# Patient Record
Sex: Female | Born: 2000
Health system: Southern US, Community
[De-identification: ages and names within clinical notes are randomized; demographics above are authoritative.]

## PROBLEM LIST (undated history)

## (undated) DIAGNOSIS — J020 Streptococcal pharyngitis: Secondary | ICD-10-CM

## (undated) DIAGNOSIS — G43909 Migraine, unspecified, not intractable, without status migrainosus: Secondary | ICD-10-CM

## (undated) DIAGNOSIS — F909 Attention-deficit hyperactivity disorder, unspecified type: Secondary | ICD-10-CM

## (undated) HISTORY — DX: Attention-deficit hyperactivity disorder, unspecified type: F90.9

## (undated) HISTORY — DX: Streptococcal pharyngitis: J02.0

---
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
## 2020-02-05 ENCOUNTER — Other Ambulatory Visit: Payer: Self-pay | Admitting: Family Medicine

## 2020-02-06 ENCOUNTER — Other Ambulatory Visit (HOSPITAL_BASED_OUTPATIENT_CLINIC_OR_DEPARTMENT_OTHER): Payer: Self-pay | Admitting: Family Medicine

## 2020-02-06 MED FILL — SERTRALINE HCL 100 MG TABS: 100 | 90 days supply | Qty: 90 | Fill #0

## 2020-02-06 MED FILL — SERTRALINE HCL 25 MG TABLET: 25 | 30 days supply | Qty: 30 | Fill #0

## 2020-02-12 ENCOUNTER — Telehealth (INDEPENDENT_AMBULATORY_CARE_PROVIDER_SITE_OTHER): Payer: 59 | Admitting: Psychiatry

## 2020-02-12 ENCOUNTER — Encounter (HOSPITAL_COMMUNITY): Payer: Self-pay | Admitting: Psychiatry

## 2020-02-12 ENCOUNTER — Other Ambulatory Visit: Payer: Self-pay

## 2020-02-12 VITALS — Wt 109.0 lb

## 2020-02-12 DIAGNOSIS — F411 Generalized anxiety disorder: Secondary | ICD-10-CM

## 2020-02-12 DIAGNOSIS — F900 Attention-deficit hyperactivity disorder, predominantly inattentive type: Secondary | ICD-10-CM

## 2020-02-12 NOTE — Progress Notes (Signed)
Virtual Visit via Video Note  I connected with Brenda Mendoza on 02/12/20 at 11:00 AM EST by a video enabled telemedicine application and verified that I am speaking with the correct person using two identifiers.  Location: Patient: Home Provider: Home office   I discussed the limitations of evaluation and management by telemedicine and the availability of in person appointments. The patient expressed understanding and agreed to proceed.  History of Present Illness: Brenda Mendoza is a 19 year old Caucasian female who was seen first time 3 months ago.  She was referred from her PCP Dr. Linford Arnold for the management of ADD and anxiety symptoms.  We have recommended to try low-dose Vyvanse as patient is already taking the Zoloft and the dose was increased by the PCP recently.  Patient never started the Vyvanse because she was concerned and anxious to start a new medication.  She was not sure how it will react to her body but also apologized not calling us about it.  She told month ago broke up with her boyfriend because she believes they do not get along.  Relationship was almost 19 years old.  She feel worse than her anxiety went up.  Her PCP added another 25 mg of Zoloft and now she is taking Zoloft 125 mg daily.  She feels her anxiety is somewhat better but she continues to struggle with attention, focus, multitasking.  She has to write down the list of tasks to complete her work.  She admitted easily forgetful and having memory issues and get distracted.  She like to try the medicine.  She struggle with staying in sleep and she had lost few pounds since the last visit.  She had a good support from her roommate and her sister.  She is also thinking to go back to school to start cosmetology which she wanted to do.  She denies drinking or using any illegal substances.  She denies any mania, psychosis, hallucination or any suicidal thoughts.  She is working in [employer] and she was promoted few months ago.  Her  parents are divorced.  She has an older sister.  Past Psychiatric History: H/O anxiety since school age.  Saw psychiatrist Dr. Milana Kidney for few months.  Diagnosed with ADD after psychological testing at [facility] by Dustin Folks and given medicine but do not recall.  Had tried Effexor but caused withdrawal symptoms, Wellbutrin and Viibryd did not work.  No history of suicidal attempt, inpatient, mania, psychosis, PTSD or OCD symptoms.  Psychiatric Specialty Exam: Physical Exam  Review of Systems  Weight 109 lb (49.4 kg).There is no height or weight on file to calculate BMI.  General Appearance: Casual and hair dyed.  Eye Contact:  Good  Speech:  Normal Rate  Volume:  Decreased  Mood:  Anxious  Affect:  Congruent  Thought Process:  Goal Directed  Orientation:  Full (Time, Place, and Person)  Thought Content:  Rumination  Suicidal Thoughts:  No  Homicidal Thoughts:  No  Memory:  Immediate;   Good Recent;   Good Remote;   Fair  Judgement:  Fair  Insight:  Shallow  Psychomotor Activity:  Increased  Concentration:  Concentration: Fair and Attention Span: Fair  Recall:  Good  Fund of Knowledge:  Good  Language:  Good  Akathisia:  No  Handed:  Right  AIMS (if indicated):     Assets:  Communication Skills Desire for Improvement Housing Resilience Social Support Talents/Skills Transportation  ADL's:  Intact  Cognition:  WNL  Sleep:   fair      Assessment and Plan: Generalized anxiety disorder.  ADD, inattentive type.  Reassurance given about ADD medication.  Explained that in the beginning any psychotropic medication can cause side effects but eventually it go away.  I discussed most of the ADD medicine can cause worsening of anxiety but she is already taking hydroxyzine and Zoloft 125 mg to help with anxiety.  However if she feels her anxiety is getting worse then she can call us immediately.  Patient promised to stop the medication and I recommend to try on the days  when she does not go to work to see how she get react to the medicine.  Recommended to call us back if she has any question.  We will follow-up in 3 weeks.  No new medicine given since patient has not started and she has the prescription.  Her Zoloft and hydroxyzine is given by her PCP.  She is also taking Topamax for her migraines.  Follow Up Instructions:    I discussed the assessment and treatment plan with the patient. The patient was provided an opportunity to ask questions and all were answered. The patient agreed with the plan and demonstrated an understanding of the instructions.   The patient was advised to call back or seek an in-person evaluation if the symptoms worsen or if the condition fails to improve as anticipated.  I provided 15 minutes of non-face-to-face time during this encounter.   Cleotis Nipper, MD

## 2020-03-06 ENCOUNTER — Encounter: Payer: Self-pay | Admitting: Family Medicine

## 2020-03-06 ENCOUNTER — Ambulatory Visit (INDEPENDENT_AMBULATORY_CARE_PROVIDER_SITE_OTHER): Payer: 59 | Admitting: Family Medicine

## 2020-03-06 ENCOUNTER — Telehealth (HOSPITAL_COMMUNITY): Payer: 59 | Admitting: Psychiatry

## 2020-03-06 ENCOUNTER — Other Ambulatory Visit (HOSPITAL_BASED_OUTPATIENT_CLINIC_OR_DEPARTMENT_OTHER): Payer: Self-pay | Admitting: Family Medicine

## 2020-03-06 VITALS — BP 102/57 | HR 64 | Ht 61.0 in | Wt 112.0 lb

## 2020-03-06 DIAGNOSIS — R6889 Other general symptoms and signs: Secondary | ICD-10-CM | POA: Diagnosis not present

## 2020-03-06 DIAGNOSIS — Z113 Encounter for screening for infections with a predominantly sexual mode of transmission: Secondary | ICD-10-CM

## 2020-03-06 DIAGNOSIS — F418 Other specified anxiety disorders: Secondary | ICD-10-CM | POA: Diagnosis not present

## 2020-03-06 DIAGNOSIS — G43109 Migraine with aura, not intractable, without status migrainosus: Secondary | ICD-10-CM | POA: Diagnosis not present

## 2020-03-06 DIAGNOSIS — N898 Other specified noninflammatory disorders of vagina: Secondary | ICD-10-CM | POA: Diagnosis not present

## 2020-03-06 MED ORDER — SERTRALINE HCL 25 MG PO TABS: 25.0000 mg | ORAL_TABLET | Freq: Every day | ORAL | 0 refills | Status: DC

## 2020-03-06 MED FILL — SERTRALINE HCL 25 MG TABLET: 25 | 30 days supply | Qty: 30 | Fill #0

## 2020-03-06 NOTE — Assessment & Plan Note (Addendum)
Well on current regimen of Topamax and Maxalt.  Follow-up in 6 months.  Okay to return sooner if having more frequent headaches.  If doing well at that point we might even consider tapering the Topamax.

## 2020-03-06 NOTE — Progress Notes (Signed)
Established Patient Office Visit  Subjective:  Patient ID: Brenda Mendoza, female    DOB: [birth year]  Age: 19 y.o. MRN: [id number]  CC:  Chief Complaint  Patient presents with  . Migraine    HPI [name]     Here today to follow-up for migraine headaches-overall she is actually doing well she still on a very low-dose of Topamax but feels like it is been fairly effective she is only had a few migraine since I last saw her 3 months ago she feels like the Maxalt works about 75% of the time not 100%.  But has been happy with her regimen thus far.  Is also been working with her psychiatrist and therapist and had to reschedule her appointment that was today evidently it was double booked.  She has been taking extra 25 mg of Zoloft and feels like that is been helpful but she also thinks now that its been a couple months since her break-up that that has been helpful as well with just time passing and really cannot coming to grips with what is going on.  He says she never actually tried the Vyvanse to help with her memory she was really struggling with her memory and focus.  She still has the prescription and says she might try it but she is not sure.  She also reports that she has been having a yellow-colored discharge for quite some time.  She just wanted to make sure there was normal.  She has not had any STD testing done since her break-up with her long-term boyfriend.  He also reports that she feels cold all the time.  She is asymptomatic.  No irritation, itching, rash, vaginal odor.  Past Medical History:  Diagnosis Date  . ADHD (attention deficit hyperactivity disorder)   . Migraine   . Strep throat    multiple times    Past Surgical History:  Procedure Laterality Date  . TONSILLECTOMY AND ADENOIDECTOMY  2009  . TYMPANOSTOMY TUBE PLACEMENT  2004    Family History  Problem Relation Age of Onset  . Depression Mother   . Migraines Mother   . ADD / ADHD Father   .  Pancreatic cancer Maternal Grandmother 74  . Lung cancer Maternal Grandfather 48  . Heart disease Other   . Depression Sister     Social History   Socioeconomic History  . Marital status: Single    Spouse name: Not on file  . Number of children: Not on file  . Years of education: Not on file  . Highest education level: Not on file  Occupational History  . Occupation: Consulting civil engineer  Tobacco Use  . Smoking status: Passive Smoke Exposure - Never Smoker  . Smokeless tobacco: Never Used  . Tobacco comment: Outside smoking by mom  Vaping Use  . Vaping Use: Never used  Substance and Sexual Activity  . Alcohol use: No    Alcohol/week: 0.0 standard drinks  . Drug use: No  . Sexual activity: Not on file  Other Topics Concern  . Not on file  Social History Narrative   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 She likes to horseback ride. Otherwise no regular exercise.   Social Determinants of Health   Financial Resource Strain:   . Difficulty of Paying Living Expenses: Not on file  Food Insecurity:   . Worried About Programme researcher, broadcasting/film/video in the Last Year: Not on file  . Ran Out of Food in the  Last Year: Not on file  Transportation Needs:   . Lack of Transportation (Medical): Not on file  . Lack of Transportation (Non-Medical): Not on file  Physical Activity:   . Days of Exercise per Week: Not on file  . Minutes of Exercise per Session: Not on file  Stress:   . Feeling of Stress : Not on file  Social Connections:   . Frequency of Communication with Friends and Family: Not on file  . Frequency of Social Gatherings with Friends and Family: Not on file  . Attends Religious Services: Not on file  . Active Member of Clubs or Organizations: Not on file  . Attends Banker Meetings: Not on file  . Marital Status: Not on file  Intimate Partner Violence:   . Fear of Current or Ex-Partner: Not on file  . Emotionally Abused: Not on file  . Physically Abused: Not on file  .  Sexually Abused: Not on file    Outpatient Medications Prior to Visit  Medication Sig Dispense Refill  . drospirenone-ethinyl estradiol (YASMIN) 3-0.03 MG tablet Take 1 tablet by mouth daily. 84 tablet 4  . hydrOXYzine (ATARAX/VISTARIL) 25 MG tablet TAKE 1 - 2 TABLETS BY MOUTH THREE TIMES DAILY AS NEEDED FOR ANXIETY 30 tablet 0  . lisdexamfetamine (VYVANSE) 20 MG capsule Take 1 capsule (20 mg total) by mouth daily. 30 capsule 0  . rizatriptan (MAXALT-MLT) 5 MG disintegrating tablet Take 1 tablet (5 mg total) by mouth as needed for migraine. May repeat in 2 hours if needed. This is 90 day supply 30 tablet 2  . sertraline (ZOLOFT) 100 MG tablet TAKE 1 TABLET (100 MG TOTAL) BY MOUTH DAILY. *THIS TAKES PLACE OF SERTRALINE 50MG  2 TABS DAILY* 90 tablet 0  . topiramate (TOPAMAX) 25 MG tablet Take 1 tablet (25 mg total) by mouth daily. 90 tablet 1  . sertraline (ZOLOFT) 25 MG tablet TAKE 1 TABLET (25 MG TOTAL) BY MOUTH DAILY. TAKE AT THE SAME TIME AS HER 100 MG DOSE. 30 tablet 0   No facility-administered medications prior to visit.    Allergies  Allergen Reactions  . Intuniv [Guanfacine] Other (See Comments)    Excess sedation, Daytrana patch.   . Prochlorperazine Rash    ROS Review of Systems    Objective:    Physical Exam Vitals reviewed.  Constitutional:      Appearance: She is well-developed.  HENT:     Head: Normocephalic and atraumatic.  Eyes:     Conjunctiva/sclera: Conjunctivae normal.  Cardiovascular:     Rate and Rhythm: Normal rate.  Pulmonary:     Effort: Pulmonary effort is normal.  Skin:    General: Skin is dry.     Coloration: Skin is not pale.  Neurological:     Mental Status: She is alert and oriented to person, place, and time.  Psychiatric:        Behavior: Behavior normal.     BP (!) 102/57   Pulse 64   Ht 5\' 1"  (1.549 m)   Wt 112 lb (50.8 kg)   LMP 03/06/2020 (Exact Date)   SpO2 100%   BMI 21.16 kg/m  Wt Readings from Last 3 Encounters:   03/06/20 112 lb (50.8 kg) (20 %, Z= -0.85)*  12/06/19 114 lb (51.7 kg) (24 %, Z= -0.69)*  10/11/19 111 lb (50.3 kg) (19 %, Z= -0.87)*   * Growth percentiles are based on CDC (Girls, 2-20 Years) data.     Health Maintenance Due  Topic Date Due  . HIV Screening  Never done    There are no preventive care reminders to display for this patient.  Lab Results  Component Value Date   TSH 2.19 06/22/2016   Lab Results  Component Value Date   WBC 7.1 10/11/2019   HGB 13.5 10/11/2019   HCT 40.8 10/11/2019   MCV 85.7 10/11/2019   PLT 373 10/11/2019   Lab Results  Component Value Date   NA 139 10/11/2019   K 4.7 10/11/2019   CO2 28 10/11/2019   GLUCOSE 81 10/11/2019   BUN 13 10/11/2019   CREATININE 0.68 10/11/2019   BILITOT 0.5 10/11/2019   ALKPHOS 81 06/26/2015   AST 16 10/11/2019   ALT 12 10/11/2019   PROT 6.5 10/11/2019   ALBUMIN 4.0 06/26/2015   CALCIUM 9.5 10/11/2019   Lab Results  Component Value Date   CHOL 268 (H) 10/11/2019   Lab Results  Component Value Date   HDL 68 10/11/2019   Lab Results  Component Value Date   LDLCALC 168 (H) 10/11/2019   Lab Results  Component Value Date   TRIG 170 (H) 10/11/2019   Lab Results  Component Value Date   CHOLHDL 3.9 10/11/2019   Lab Results  Component Value Date   HGBA1C n 01/08/2015      Assessment & Plan:   Problem List Items Addressed This Visit      Cardiovascular and Mediastinum   Migraine with aura and without status migrainosus, not intractable - Primary (Chronic)    Well on current regimen of Topamax and Maxalt.  Follow-up in 6 months.  Okay to return sooner if having more frequent headaches.  If doing well at that point we might even consider tapering the Topamax.      Relevant Medications   sertraline (ZOLOFT) 25 MG tablet     Other   Depression with anxiety    She has continue the extra 25 mg of sertraline on top of her 100 mg and feels like it is been helpful she would like to continue  it for a little bit longer.  Did encourage her to let her psychiatrist know that she is taking it when they follow-up later this month.      Relevant Medications   sertraline (ZOLOFT) 25 MG tablet    Other Visit Diagnoses    Vaginal discharge       Relevant Orders   WET PREP FOR TRICH, YEAST, CLUE   RPR   C. trachomatis/N. gonorrhoeae RNA   Cold sensitivity       Relevant Orders   TSH + free T4   Fe+TIBC+Fer   Screen for STD (sexually transmitted disease)       Relevant Orders   WET PREP FOR TRICH, YEAST, CLUE   HIV Antibody (routine testing w rflx)   RPR   C. trachomatis/N. gonorrhoeae RNA      Cold sensitivity-we will check thyroid and iron levels.  Recommend screening for STDs since break-up of her long-term boyfriend.  Explained that yellow vaginal discharge can be physiologic and is not necessarily abnormal especially without symptoms.  We will also check a wet prep and for GC and chlamydia.  Meds ordered this encounter  Medications  . sertraline (ZOLOFT) 25 MG tablet    Sig: Take 1 tablet (25 mg total) by mouth daily. Take at the same time as her 100 mg dose.    Dispense:  30 tablet    Refill:  0  Follow-up: Return in about 6 months (around 09/04/2020) for Migraines .    Nani Gasseratherine Clerence Gubser, MD

## 2020-03-06 NOTE — Assessment & Plan Note (Signed)
She has continue the extra 25 mg of sertraline on top of her 100 mg and feels like it is been helpful she would like to continue it for a little bit longer.  Did encourage her to let her psychiatrist know that she is taking it when they follow-up later this month.

## 2020-03-13 ENCOUNTER — Other Ambulatory Visit: Payer: Self-pay

## 2020-03-13 ENCOUNTER — Telehealth (INDEPENDENT_AMBULATORY_CARE_PROVIDER_SITE_OTHER): Payer: 59 | Admitting: Psychiatry

## 2020-03-13 ENCOUNTER — Encounter (HOSPITAL_COMMUNITY): Payer: Self-pay | Admitting: Psychiatry

## 2020-03-13 VITALS — Wt 112.0 lb

## 2020-03-13 DIAGNOSIS — F418 Other specified anxiety disorders: Secondary | ICD-10-CM

## 2020-03-13 DIAGNOSIS — F900 Attention-deficit hyperactivity disorder, predominantly inattentive type: Secondary | ICD-10-CM | POA: Diagnosis not present

## 2020-03-13 NOTE — Progress Notes (Signed)
Virtual Visit via Telephone Note  I connected with Brenda Mendoza on 03/13/20 at  1:20 PM EST by telephone and verified that I am speaking with the correct person using two identifiers.  Location: Patient: home Provider: home office   I discussed the limitations, risks, security and privacy concerns of performing an evaluation and management service by telephone and the availability of in person appointments. I also discussed with the patient that there may be a patient responsible charge related to this service. The patient expressed understanding and agreed to proceed.   History of Present Illness: Patient is evaluated by phone session.  She is on the phone by herself.  We started her on low-dose Vyvanse but she took it for only few days because she noticed it did not help her focus and may actually felt her focus is worse.  But she is not sure because recently she received booster dose of maternal vaccine and has been feeling sick.  She is tired, sleeping too much.  She needed more time to try the Vyvanse.  Overall she feels her depression is okay.  She is getting Zoloft 125 mg prescribed by her PCP.  She denies any crying spells or any feeling of hopelessness.  She admitted continued struggle with multitasking, attention, concentration.  She has to write things to remind herself.  Since that broke up with the boyfriend she has not started any new relationship.  She has a plan to celebrate Christmas with the family.  Her appetite is okay.  She admitted feeling tired but no change in her appetite.  She denies any mania, psychosis, hallucination.  She continues to work some peak full-time and some week part-time at [employer].  Her job is okay other than she tends to forget sometimes.   Psychiatric Specialty Exam: Physical Exam  Review of Systems  Weight 112 lb (50.8 kg), last menstrual period 03/06/2020.There is no height or weight on file to calculate BMI.  General Appearance: NA  Eye Contact:   NA  Speech:  Slow  Volume:  Normal  Mood:  tired  Affect:  NA  Thought Process:  Goal Directed  Orientation:  Full (Time, Place, and Person)  Thought Content:  WDL  Suicidal Thoughts:  No  Homicidal Thoughts:  No  Memory:  Immediate;   ok Recent;   ok Remote;   ok  Judgement:  Intact  Insight:  Present  Psychomotor Activity:  NA  Concentration:  Concentration: Fair and Attention Span: Fair  Recall:  Fair  Fund of Knowledge:  Good  Language:  Good  Akathisia:  No  Handed:  Right  AIMS (if indicated):     Assets:  Communication Skills Desire for Improvement Housing Resilience Social Support Talents/Skills Transportation  ADL's:  Intact  Cognition:  WNL  Sleep:   too much      Assessment and Plan: ADD, inattentive type.  Anxiety.  Patient anxiety and depression is well controlled by Zoloft 125 mg daily.  At this time she is not taking Vyvanse 20 mg but like to give more time to see if that helps her.  I discussed the other option is to try Strattera since it does work differently to help the focus.  Patient promised that she will call us but like to give more time.  Vyvanse.  Discussed medication side effects and benefits.  Encouraged to call us back if she is any question, concern or if she feels worsening of the symptom.  She has pills  remaining for Vyvanse and if she feels it is working then she will call us for new refill.  She is taking Topamax and hydroxyzine.  Recommended follow-up in 2 months.  Follow Up Instructions:    I discussed the assessment and treatment plan with the patient. The patient was provided an opportunity to ask questions and all were answered. The patient agreed with the plan and demonstrated an understanding of the instructions.   The patient was advised to call back or seek an in-person evaluation if the symptoms worsen or if the condition fails to improve as anticipated.  I provided 17 minutes of non-face-to-face time during this  encounter.   Cleotis Nipper, MD

## 2020-03-14 ENCOUNTER — Ambulatory Visit (INDEPENDENT_AMBULATORY_CARE_PROVIDER_SITE_OTHER): Payer: 59 | Admitting: Physician Assistant

## 2020-03-14 ENCOUNTER — Encounter: Payer: Self-pay | Admitting: Physician Assistant

## 2020-03-14 ENCOUNTER — Other Ambulatory Visit (HOSPITAL_BASED_OUTPATIENT_CLINIC_OR_DEPARTMENT_OTHER): Payer: Self-pay | Admitting: Physician Assistant

## 2020-03-14 ENCOUNTER — Other Ambulatory Visit: Payer: Self-pay

## 2020-03-14 VITALS — BP 114/73 | HR 78 | Ht 61.0 in | Wt 114.0 lb

## 2020-03-14 DIAGNOSIS — K2900 Acute gastritis without bleeding: Secondary | ICD-10-CM | POA: Diagnosis not present

## 2020-03-14 MED ORDER — LIDOCAINE VISCOUS HCL 2 % MT SOLN
15.0000 mL | Freq: Once | OROMUCOSAL | Status: AC
  Administered 2020-03-14: 15 mL via OROMUCOSAL

## 2020-03-14 MED ORDER — HYOSCYAMINE SULFATE 0.125 MG PO TBDP
0.1250 mg | ORAL_TABLET | Freq: Once | ORAL | Status: AC
  Administered 2020-03-14: 0.125 mg via SUBLINGUAL

## 2020-03-14 MED ORDER — ALUM & MAG HYDROXIDE-SIMETH 200-200-20 MG/5ML PO SUSP
30.0000 mL | Freq: Once | ORAL | Status: AC
Start: 2020-03-14 — End: 2020-03-14
  Administered 2020-03-14: 30 mL via ORAL

## 2020-03-14 MED ORDER — OMEPRAZOLE 20 MG PO CPDR: 20.0000 mg | DELAYED_RELEASE_CAPSULE | Freq: Every day | ORAL | 1 refills | Status: DC

## 2020-03-14 MED FILL — OMEPRAZOLE 20 MG CAP: 20 | 30 days supply | Qty: 30 | Fill #0

## 2020-03-14 MED FILL — DROSPIRENONE-EE 3-0.03 MG T: 3-0.03 | 84 days supply | Qty: 84 | Fill #2

## 2020-03-14 NOTE — Progress Notes (Addendum)
Acute Office Visit  Subjective:    Patient ID: Brenda Mendoza, female    DOB: June 19, 2000, 19 y.o.   MRN: [id number]  Chief Complaint  Patient presents with  . Abdominal Pain    HPI Patient is in today for epigastric abdominal pain x two days. She had her COVID booster shot on Tuesday and took 2 aleeve and alkaseltzer cold on Wednesday morning and then pain started Wednesday night. Pain comes and goes and does wake her from sleep during the night. Pain is the same during the day and night. Pain feels like stabbing.  Patient reports a similar episode of pain within the last year after taking Excedrin.  Patient has been eating. Grilled cheese didn't make the pain worse but she tried to eat tomato soup and that did exacerbate pain. Patient reports drinking propel.   She denies any melena or hematochezia. No vomiting.   Patient reports occasional alcohol consumption but not within the last week. Patient denies vaping/smoking/drug use.  Past Medical History:  Diagnosis Date  . ADHD (attention deficit hyperactivity disorder)   . Migraine   . Strep throat    multiple times    Past Surgical History:  Procedure Laterality Date  . TONSILLECTOMY AND ADENOIDECTOMY  2009  . TYMPANOSTOMY TUBE PLACEMENT  2004    Family History  Problem Relation Age of Onset  . Depression Mother   . Migraines Mother   . ADD / ADHD Father   . Pancreatic cancer Maternal Grandmother 52  . Lung cancer Maternal Grandfather 48  . Heart disease Other   . Depression Sister     Social History   Socioeconomic History  . Marital status: Single    Spouse name: Not on file  . Number of children: Not on file  . Years of education: Not on file  . Highest education level: Not on file  Occupational History  . Occupation: Consulting civil engineer  Tobacco Use  . Smoking status: Passive Smoke Exposure - Never Smoker  . Smokeless tobacco: Never Used  . Tobacco comment: Outside smoking by mom  Vaping Use  . Vaping Use:  Never used  Substance and Sexual Activity  . Alcohol use: No    Alcohol/week: 0.0 standard drinks  . Drug use: No  . Sexual activity: Not on file  Other Topics Concern  . Not on file  Social History Narrative   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 She likes to horseback ride. Otherwise no regular exercise.   Social Determinants of Health   Financial Resource Strain: Not on file  Food Insecurity: Not on file  Transportation Needs: Not on file  Physical Activity: Not on file  Stress: Not on file  Social Connections: Not on file  Intimate Partner Violence: Not on file    Outpatient Medications Prior to Visit  Medication Sig Dispense Refill  . drospirenone-ethinyl estradiol (YASMIN) 3-0.03 MG tablet Take 1 tablet by mouth daily. 84 tablet 4  . hydrOXYzine (ATARAX/VISTARIL) 25 MG tablet TAKE 1 - 2 TABLETS BY MOUTH THREE TIMES DAILY AS NEEDED FOR ANXIETY 30 tablet 0  . lisdexamfetamine (VYVANSE) 20 MG capsule Take 1 capsule (20 mg total) by mouth daily. 30 capsule 0  . rizatriptan (MAXALT-MLT) 5 MG disintegrating tablet Take 1 tablet (5 mg total) by mouth as needed for migraine. May repeat in 2 hours if needed. This is 90 day supply 30 tablet 2  . sertraline (ZOLOFT) 100 MG tablet TAKE 1 TABLET (100 MG TOTAL) BY MOUTH  DAILY. *THIS TAKES PLACE OF SERTRALINE 50MG  2 TABS DAILY* 90 tablet 0  . sertraline (ZOLOFT) 25 MG tablet Take 1 tablet (25 mg total) by mouth daily. Take at the same time as her 100 mg dose. 30 tablet 0  . topiramate (TOPAMAX) 25 MG tablet Take 1 tablet (25 mg total) by mouth daily. 90 tablet 1   No facility-administered medications prior to visit.    Allergies  Allergen Reactions  . Intuniv [Guanfacine] Other (See Comments)    Excess sedation, Daytrana patch.   . Prochlorperazine Rash    Review of Systems  Gastrointestinal: Positive for nausea. Negative for constipation, diarrhea and vomiting.       Objective:    Physical Exam Vitals reviewed.   Constitutional:      Appearance: She is well-developed.  HENT:     Head: Normocephalic.  Abdominal:     General: Bowel sounds are normal. There is no distension.     Palpations: Abdomen is soft.     Tenderness: There is abdominal tenderness in the right upper quadrant and epigastric area. There is no right CVA tenderness, left CVA tenderness, guarding or rebound. Negative signs include McBurney's sign.  Neurological:     General: No focal deficit present.     Mental Status: She is alert.  Psychiatric:        Mood and Affect: Mood normal.     BP 114/73   Pulse 78   Ht 5\' 1"  (1.549 m)   Wt 114 lb (51.7 kg)   LMP 03/06/2020 (Exact Date)   SpO2 100%   BMI 21.54 kg/m         Assessment & Plan:   Acute Gastritis - Abdominal pain exacerbated by NSAID use and acidic foods (tomatos)  - GI cocktail given in office today - Start PPI for one month  - f/u in one month with PCP or sooner if needed  Ddx - Unlikely to be acute cholecystitis as pain is not worsened by food and patient does not have risk factors - Unlikely to be pancreatitis due to lack of risk factors but plan to check lipase to r/o  Suspicious of underlying acid reflux given acute gastritis episode after one dose of NSAIDs. Handout provided and education given on avoiding acidic foods, alcohol, NSAIDs. She may need to continue on omeprazole daily. Please discuss ongoing symptoms with PCP.   Meds ordered this encounter  Medications  . lidocaine (XYLOCAINE) 2 % viscous mouth solution 15 mL  . hyoscyamine (ANASPAZ) disintergrating tablet 0.125 mg  . alum & mag hydroxide-simeth (MAALOX/MYLANTA) 200-200-20 MG/5ML suspension 30 mL     . PA-C, have reviewed and agree with the above documentation in it's entirety.   Spent 30 minutes with patient discussing potential causes and current treatment plan including medications and side effects.

## 2020-03-14 NOTE — Patient Instructions (Signed)
Gastritis, Adult  Gastritis is swelling (inflammation) of the stomach. Gastritis can develop quickly (acute). It can also develop slowly over time (chronic). It is important to get help for this condition. If you do not get help, your stomach can bleed, and you can get sores (ulcers) in your stomach. What are the causes? This condition may be caused by:  Germs that get to your stomach.  Drinking too much alcohol.  Medicines you are taking.  Too much acid in the stomach.  A disease of the intestines or stomach.  Stress.  An allergic reaction.  Crohn's disease.  Some cancer treatments (radiation). Sometimes the cause of this condition is not known. What are the signs or symptoms? Symptoms of this condition include:  Pain in your stomach.  A burning feeling in your stomach.  Feeling sick to your stomach (nauseous).  Throwing up (vomiting).  Feeling too full after you eat.  Weight loss.  Bad breath.  Throwing up blood.  Blood in your poop (stool). How is this diagnosed? This condition may be diagnosed with:  Your medical history and symptoms.  A physical exam.  Tests. These can include: ? Blood tests. ? Stool tests. ? A procedure to look inside your stomach (upper endoscopy). ? A test in which a sample of tissue is taken for testing (biopsy). How is this treated? Treatment for this condition depends on what caused it. You may be given:  Antibiotic medicine, if your condition was caused by germs.  H2 blockers and similar medicines, if your condition was caused by too much acid. Follow these instructions at home: Medicines  Take over-the-counter and prescription medicines only as told by your doctor.  If you were prescribed an antibiotic medicine, take it as told by your doctor. Do not stop taking it even if you start to feel better. Eating and drinking   Eat small meals often, instead of large meals.  Avoid foods and drinks that make your symptoms  worse.  Drink enough fluid to keep your pee (urine) pale yellow. Alcohol use  Do not drink alcohol if: ? Your doctor tells you not to drink. ? You are pregnant, may be pregnant, or are planning to become pregnant.  If you drink alcohol: ? Limit your use to:  0-1 drink a day for women.  0-2 drinks a day for men. ? Be aware of how much alcohol is in your drink. In the [place] one drink equals one 12 oz bottle of beer (355 mL), one 5 oz glass of wine (148 mL), or one 1 oz glass of hard liquor (44 mL). General instructions  Talk with your doctor about ways to manage stress. You can exercise or do deep breathing, meditation, or yoga.  Do not smoke or use products that have nicotine or tobacco. If you need help quitting, ask your doctor.  Keep all follow-up visits as told by your doctor. This is important. Contact a doctor if:  Your symptoms get worse.  Your symptoms go away and then come back. Get help right away if:  You throw up blood or something that looks like coffee grounds.  You have black or dark red poop.  You throw up any time you try to drink fluids.  Your stomach pain gets worse.  You have a fever.  You do not feel better after one week. Summary  Gastritis is swelling (inflammation) of the stomach.  You must get help for this condition. If you do not get help, your stomach   can bleed, and you can get sores (ulcers).  This condition is diagnosed with medical history, physical exam, or tests.  You can be treated with medicines for germs or medicines to block too much acid in your stomach. This information is not intended to replace advice given to you by your health care provider. Make sure you discuss any questions you have with your health care provider. Document Revised: 08/09/2017 Document Reviewed: 08/09/2017 Elsevier Patient Education  2020 Elsevier Inc.  

## 2020-03-15 LAB — CBC WITH DIFFERENTIAL/PLATELET
Absolute Monocytes: 462 cells/uL (ref 200–950)
Basophils Absolute: 27 cells/uL (ref 0–200)
Basophils Relative: 0.4 %
Eosinophils Absolute: 109 cells/uL (ref 15–500)
Eosinophils Relative: 1.6 %
HCT: 42 % (ref 35.0–45.0)
Hemoglobin: 14 g/dL (ref 11.7–15.5)
Lymphs Abs: 1904 cells/uL (ref 850–3900)
MCH: 29.2 pg (ref 27.0–33.0)
MCHC: 33.3 g/dL (ref 32.0–36.0)
MCV: 87.5 fL (ref 80.0–100.0)
MPV: 11.3 fL (ref 7.5–12.5)
Monocytes Relative: 6.8 %
Neutro Abs: 4298 cells/uL (ref 1500–7800)
Neutrophils Relative %: 63.2 %
Platelets: 225 10*3/uL (ref 140–400)
RBC: 4.8 10*6/uL (ref 3.80–5.10)
RDW: 12.4 % (ref 11.0–15.0)
Total Lymphocyte: 28 %
WBC: 6.8 10*3/uL (ref 3.8–10.8)

## 2020-03-15 LAB — COMPLETE METABOLIC PANEL WITH GFR
AG Ratio: 1.7 (calc) (ref 1.0–2.5)
ALT: 21 U/L (ref 5–32)
AST: 23 U/L (ref 12–32)
Albumin: 4.1 g/dL (ref 3.6–5.1)
Alkaline phosphatase (APISO): 77 U/L (ref 36–128)
BUN: 9 mg/dL (ref 7–20)
CO2: 22 mmol/L (ref 20–32)
Calcium: 9 mg/dL (ref 8.9–10.4)
Chloride: 108 mmol/L (ref 98–110)
Creat: 0.65 mg/dL (ref 0.50–1.00)
GFR, Est African American: 149 mL/min/{1.73_m2} (ref >=60)
GFR, Est Non African American: 129 mL/min/{1.73_m2} (ref >=60)
Globulin: 2.4 g/dL (calc) (ref 2.0–3.8)
Glucose, Bld: 83 mg/dL (ref 65–99)
Potassium: 4 mmol/L (ref 3.8–5.1)
Sodium: 142 mmol/L (ref 135–146)
Total Bilirubin: 0.3 mg/dL (ref 0.2–1.1)
Total Protein: 6.5 g/dL (ref 6.3–8.2)

## 2020-03-15 LAB — LIPASE: Lipase: 17 U/L (ref 7–60)

## 2020-03-17 ENCOUNTER — Encounter: Payer: Self-pay | Admitting: Physician Assistant

## 2020-03-17 DIAGNOSIS — K2900 Acute gastritis without bleeding: Secondary | ICD-10-CM | POA: Insufficient documentation

## 2020-03-17 NOTE — Progress Notes (Signed)
Labs look great. No anemia. No elevated lipase, liver enzymes or kidney function.

## 2020-05-02 ENCOUNTER — Encounter: Payer: Self-pay | Admitting: Medical-Surgical

## 2020-05-02 ENCOUNTER — Ambulatory Visit (INDEPENDENT_AMBULATORY_CARE_PROVIDER_SITE_OTHER): Payer: 59

## 2020-05-02 ENCOUNTER — Ambulatory Visit (INDEPENDENT_AMBULATORY_CARE_PROVIDER_SITE_OTHER): Payer: 59 | Admitting: Medical-Surgical

## 2020-05-02 ENCOUNTER — Other Ambulatory Visit: Payer: Self-pay

## 2020-05-02 VITALS — BP 114/71 | HR 71 | Wt 108.0 lb

## 2020-05-02 DIAGNOSIS — W108XXA Fall (on) (from) other stairs and steps, initial encounter: Secondary | ICD-10-CM | POA: Diagnosis not present

## 2020-05-02 DIAGNOSIS — W001XXA Fall from stairs and steps due to ice and snow, initial encounter: Secondary | ICD-10-CM | POA: Diagnosis not present

## 2020-05-02 DIAGNOSIS — M25561 Pain in right knee: Secondary | ICD-10-CM | POA: Diagnosis not present

## 2020-05-02 DIAGNOSIS — Z043 Encounter for examination and observation following other accident: Secondary | ICD-10-CM | POA: Diagnosis not present

## 2020-05-02 NOTE — Progress Notes (Signed)
Subjective:    CC: knee pain  HPI: Pleasant 20 year old female presenting for right knee pain after having a fall yesterday. Notes she was climbing stairs onto a deck and slipped on ice. She landed on her right knee and has two abrasions that are scabbed. Notes that she was not able to walk yesterday on it but she can today. Has managed pain with ice and a knee brace. Elevated her knee on pillows yesterday. No popping, clicking, or locking. Pain rated 2/10 at rest, 5/10 with pressure on the medial patellar surface.   I reviewed the past medical history, family history, social history, surgical history, and allergies today and no changes were needed.  Please see the problem list section below in epic for further details.   Past Medical History: Past Medical History:  Diagnosis Date  . ADHD (attention deficit hyperactivity disorder)   . Migraine   . Strep throat    multiple times   Past Surgical History: Past Surgical History:  Procedure Laterality Date  . TONSILLECTOMY AND ADENOIDECTOMY  2009  . TYMPANOSTOMY TUBE PLACEMENT  2004   Social History: Social History   Socioeconomic History  . Marital status: Single    Spouse name: Not on file  . Number of children: Not on file  . Years of education: Not on file  . Highest education level: Not on file  Occupational History  . Occupation: Consulting civil engineer  Tobacco Use  . Smoking status: Passive Smoke Exposure - Never Smoker  . Smokeless tobacco: Never Used  . Tobacco comment: Outside smoking by mom  Vaping Use  . Vaping Use: Never used  Substance and Sexual Activity  . Alcohol use: No    Alcohol/week: 0.0 standard drinks  . Drug use: No  . Sexual activity: Not on file  Other Topics Concern  . Not on file  Social History Narrative   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 She likes to horseback ride. Otherwise no regular exercise.   Social Determinants of Health   Financial Resource Strain: Not on file  Food Insecurity: Not  on file  Transportation Needs: Not on file  Physical Activity: Not on file  Stress: Not on file  Social Connections: Not on file   Family History: Family History  Problem Relation Age of Onset  . Depression Mother   . Migraines Mother   . ADD / ADHD Father   . Pancreatic cancer Maternal Grandmother 3  . Lung cancer Maternal Grandfather 48  . Heart disease Other   . Depression Sister    Allergies: Allergies  Allergen Reactions  . Intuniv [Guanfacine] Other (See Comments)    Excess sedation, Daytrana patch.   . Prochlorperazine Rash   Medications: See med rec.  Review of Systems: See HPI for pertinent positives and negatives.   Objective:    General: Well Developed, well nourished, and in no acute distress.  Neuro: Alert and oriented x3.  HEENT: Normocephalic, atraumatic.  Skin: Warm and dry. Cardiac: Regular rate and rhythm, no murmurs rubs or gallops, no lower extremity edema.  Respiratory: Clear to auscultation bilaterally. Not using accessory muscles, speaking in full sentences. Right knee: Tenderness to palpation along the medial patella from distal femur border to proximal tibia. Two small, shallow, scabbed abrasions. Mild bruising noted to the anterior knee. Pain with full extension and flexion. Pain with valgus strain.    Impression and Recommendations:    1. Fall (on) (from) other stairs and steps, initial encounter Getting x-rays today. Recommend  RICE. Manage pain with Tylenol, topical voltaren gel, and/or Lidocaine patches. Work note given for absence 05/02/2020 through 05/04/2020. May need to extend time away from work depending on x-ray results and healing process.  - DG Knee Complete 4 Views Right; Future  Return if symptoms worsen or fail to improve. ___________________________________________ Thayer Ohm, DNP, APRN, FNP-BC Primary Care and Sports Medicine [facility]

## 2020-05-09 ENCOUNTER — Other Ambulatory Visit: Payer: Self-pay

## 2020-05-09 ENCOUNTER — Telehealth (INDEPENDENT_AMBULATORY_CARE_PROVIDER_SITE_OTHER): Payer: 59 | Admitting: Psychiatry

## 2020-05-09 ENCOUNTER — Encounter (HOSPITAL_COMMUNITY): Payer: Self-pay | Admitting: Psychiatry

## 2020-05-09 VITALS — Wt 108.0 lb

## 2020-05-09 DIAGNOSIS — F900 Attention-deficit hyperactivity disorder, predominantly inattentive type: Secondary | ICD-10-CM

## 2020-05-09 DIAGNOSIS — F418 Other specified anxiety disorders: Secondary | ICD-10-CM

## 2020-05-09 NOTE — Progress Notes (Signed)
Virtual Visit via Video Note  I connected with Brenda Mendoza on 05/09/20 at 11:00 AM EST by a video enabled telemedicine application and verified that I am speaking with the correct person using two identifiers.  Location: Patient: Home Provider: Home Office   I discussed the limitations of evaluation and management by telemedicine and the availability of in person appointments. The patient expressed understanding and agreed to proceed.  History of Present Illness: Patient is evaluated by video session.  She has a diagnosis of ADD and anxiety.  She is getting Zoloft 125 mg and hydroxyzine from her PCP.  We started her on low-dose Vyvanse on the last visit however patient continues to forget to take the medicine.  She takes most of her medication at bedtime and in the morning she tends to forget to take the Vyvanse.  She is still struggle with attention and focus but it is not worsening.  Her anxiety is also under control.  She is sleeping good.  She lives with a roommate and she also talks to her mother on a regular basis.  She is working part-time at [employer] and her job is going well.  Her parents are divorced.  Patient denies any paranoia, hallucination, suicidal thoughts or any homicidal thoughts.  She denies any change in her appetite or weight.  She denies drinking or using any illegal substances.  Recently she had blood work.  Her basic chemistry and CBC is normal.  Past Psychiatric History: H/O anxiety since school age. Saw psychiatrist Dr. Milana Kidney for few months. Diagnosed with ADD after psychological [facility] at [facility] by MeganCobaldoand given medicine but do not recall. Had tried Effexor but caused withdrawal symptoms, Wellbutrin and Viibryd did not work. No history of suicidal attempt, inpatient, mania, psychosis, PTSD or OCD symptoms  Recent Results (from the past 2160 hour(s))  CBC with Differential/Platelet     Status: None   Collection Time: 03/14/20 10:53 AM  Result  Value Ref Range   WBC 6.8 3.8 - 10.8 Thousand/uL   RBC 4.80 3.80 - 5.10 Million/uL   Hemoglobin 14.0 11.7 - 15.5 g/dL   HCT 68.0 32.1 - 22.4 %   MCV 87.5 80.0 - 100.0 fL   MCH 29.2 27.0 - 33.0 pg   MCHC 33.3 32.0 - 36.0 g/dL   RDW 82.5 00.3 - 70.4 %   Platelets 225 140 - 400 Thousand/uL   MPV 11.3 7.5 - 12.5 fL   Neutro Abs 4,298 1,500 - 7,800 cells/uL   Lymphs Abs 1,904 850 - 3,900 cells/uL   Absolute Monocytes 462 200 - 950 cells/uL   Eosinophils Absolute 109 15 - 500 cells/uL   Basophils Absolute 27 0 - 200 cells/uL   Neutrophils Relative % 63.2 %   Total Lymphocyte 28.0 %   Monocytes Relative 6.8 %   Eosinophils Relative 1.6 %   Basophils Relative 0.4 %  COMPLETE METABOLIC PANEL WITH GFR     Status: None   Collection Time: 03/14/20 10:53 AM  Result Value Ref Range   Glucose, Bld 83 65 - 99 mg/dL    Comment: .            Fasting reference interval .    BUN 9 7 - 20 mg/dL   Creat 8.88 9.16 - 9.45 mg/dL   GFR, Est Non African American 129 > OR = 60 mL/min/1.60m2   GFR, Est African American 149 > OR = 60 mL/min/1.61m2   BUN/Creatinine Ratio NOT APPLICABLE 6 - 22 (calc)  Sodium 142 135 - 146 mmol/L   Potassium 4.0 3.8 - 5.1 mmol/L   Chloride 108 98 - 110 mmol/L   CO2 22 20 - 32 mmol/L   Calcium 9.0 8.9 - 10.4 mg/dL   Total Protein 6.5 6.3 - 8.2 g/dL   Albumin 4.1 3.6 - 5.1 g/dL   Globulin 2.4 2.0 - 3.8 g/dL (calc)   AG Ratio 1.7 1.0 - 2.5 (calc)   Total Bilirubin 0.3 0.2 - 1.1 mg/dL   Alkaline phosphatase (APISO) 77 36 - 128 U/L   AST 23 12 - 32 U/L   ALT 21 5 - 32 U/L  Lipase     Status: None   Collection Time: 03/14/20 10:53 AM  Result Value Ref Range   Lipase 17 7 - 60 U/L     Psychiatric Specialty Exam: Physical Exam  Review of Systems  Weight 108 lb (49 kg), last menstrual period 04/18/2020.There is no height or weight on file to calculate BMI.  General Appearance: Casual and hair dye  Eye Contact:  Good  Speech:  Normal Rate  Volume:  Normal  Mood:   Anxious  Affect:  Congruent  Thought Process:  Goal Directed  Orientation:  Full (Time, Place, and Person)  Thought Content:  Logical  Suicidal Thoughts:  No  Homicidal Thoughts:  No  Memory:  Immediate;   Good Recent;   Good Remote;   Fair  Judgement:  Fair  Insight:  Present  Psychomotor Activity:  Normal  Concentration:  Concentration: Fair and Attention Span: Fair  Recall:  Good  Fund of Knowledge:  Good  Language:  Good  Akathisia:  No  Handed:  Right  AIMS (if indicated):     Assets:  Communication Skills Desire for Improvement Housing Resilience Social Support Talents/Skills Transportation  ADL's:  Intact  Cognition:  WNL  Sleep:   ok      Assessment and Plan: Depression with anxiety.  ADD, inattentive type.  Patient has not been taking her Vyvanse consistently as she tends to forget.  She feels her anxiety and depression is a stable with Zoloft 125 mg at bedtime and hydroxyzine 25 mg at bedtime.  She has difficulty remembering to take the medication in the morning for her ADD.  We talked other alternatives including a patch or reminder to take the medicine in the morning but patient at this time feel that she is doing okay and if needed then she will call us back.  She like to keep appointment in 3 months.  Her Zoloft and hydroxyzine is refilled by her PCP.  She also takes Topamax for her migraines.  Recommended to call us back if she has any question, concern or if she feels worsening of the symptom.  No new refill of the Vyvanse given as patient is not taking regularly.  Follow-up in 3 months.     Follow Up Instructions:    I discussed the assessment and treatment plan with the patient. The patient was provided an opportunity to ask questions and all were answered. The patient agreed with the plan and demonstrated an understanding of the instructions.   The patient was advised to call back or seek an in-person evaluation if the symptoms worsen or if the  condition fails to improve as anticipated.  I provided 13 minutes of non-face-to-face time during this encounter.   Cleotis Nipper, MD

## 2020-06-20 ENCOUNTER — Other Ambulatory Visit: Payer: Self-pay | Admitting: Family Medicine

## 2020-06-20 ENCOUNTER — Other Ambulatory Visit (HOSPITAL_BASED_OUTPATIENT_CLINIC_OR_DEPARTMENT_OTHER): Payer: Self-pay | Admitting: Family Medicine

## 2020-06-20 DIAGNOSIS — F418 Other specified anxiety disorders: Secondary | ICD-10-CM

## 2020-07-23 ENCOUNTER — Telehealth: Payer: Self-pay | Admitting: Family Medicine

## 2020-07-23 ENCOUNTER — Other Ambulatory Visit: Payer: Self-pay | Admitting: Family Medicine

## 2020-07-23 NOTE — Telephone Encounter (Signed)
Call pt: we filled her script for birth control this summer but didn't realize that she gets aura with her migraines so regular birth control is contraindicated bc can increase risk of a stroke. So we have to consider a progesterone only pill or an IUD

## 2020-07-28 ENCOUNTER — Encounter: Payer: Self-pay | Admitting: Family Medicine

## 2020-07-29 NOTE — Telephone Encounter (Signed)
OK for note/etter and we can fax to her dentist/dental surgeron

## 2020-07-29 NOTE — Telephone Encounter (Signed)
Patient scheduled. She would like to come in and talk about her options.

## 2020-07-30 ENCOUNTER — Encounter: Payer: Self-pay | Admitting: Family Medicine

## 2020-07-31 ENCOUNTER — Encounter: Payer: Self-pay | Admitting: Family Medicine

## 2020-07-31 ENCOUNTER — Other Ambulatory Visit: Payer: Self-pay

## 2020-07-31 ENCOUNTER — Ambulatory Visit: Payer: 59 | Admitting: Family Medicine

## 2020-07-31 VITALS — BP 106/52 | HR 66 | Ht 61.0 in | Wt 110.0 lb

## 2020-07-31 DIAGNOSIS — G43009 Migraine without aura, not intractable, without status migrainosus: Secondary | ICD-10-CM | POA: Diagnosis not present

## 2020-07-31 DIAGNOSIS — Z01818 Encounter for other preprocedural examination: Secondary | ICD-10-CM | POA: Diagnosis not present

## 2020-07-31 DIAGNOSIS — K011 Impacted teeth: Secondary | ICD-10-CM | POA: Diagnosis not present

## 2020-07-31 NOTE — Progress Notes (Signed)
Established Patient Office Visit  Subjective:  Patient ID: Brenda Mendoza, female    DOB: 19-Feb-2001  Age: 20 y.o. MRN: [id number]  CC:  Chief Complaint  Patient presents with  . Follow-up    HPI SHILAH HEFEL presents for to discuss OCP and migraine.    We received notification from her pharmacy about having migraine with aura and being on estrogen. We had called her about this.  It turns out she has never had auras but it was changed on her chart years ago.    Her wisdom teeth are impacted and is scheduled for surgery for May 11th.  She needs clearance as she will be sedated.  She has had surgery and did well overall. No complications.  No recent CP, etc.  Negative ROS.    Past Medical History:  Diagnosis Date  . ADHD (attention deficit hyperactivity disorder)   . Migraine   . Strep throat    multiple times    Past Surgical History:  Procedure Laterality Date  . TONSILLECTOMY AND ADENOIDECTOMY  2009  . TYMPANOSTOMY TUBE PLACEMENT  2004    Family History  Problem Relation Age of Onset  . Depression Mother   . Migraines Mother   . ADD / ADHD Father   . Pancreatic cancer Maternal Grandmother 48  . Lung cancer Maternal Grandfather 48  . Heart disease Other   . Depression Sister     Social History   Socioeconomic History  . Marital status: Single    Spouse name: Not on file  . Number of children: Not on file  . Years of education: Not on file  . Highest education level: Not on file  Occupational History  . Occupation: Consulting civil engineer  Tobacco Use  . Smoking status: Passive Smoke Exposure - Never Smoker  . Smokeless tobacco: Never Used  . Tobacco comment: Outside smoking by mom  Vaping Use  . Vaping Use: Never used  Substance and Sexual Activity  . Alcohol use: No    Alcohol/week: 0.0 standard drinks  . Drug use: No  . Sexual activity: Not on file  Other Topics Concern  . Not on file  Social History Narrative   She plays clarinet. Currently in the ?  grade. She likes to horseback ride. Otherwise no regular exercise.   Social Determinants of Health   Financial Resource Strain: Not on file  Food Insecurity: Not on file  Transportation Needs: Not on file  Physical Activity: Not on file  Stress: Not on file  Social Connections: Not on file  Intimate Partner Violence: Not on file    Outpatient Medications Prior to Visit  Medication Sig Dispense Refill  . drospirenone-ethinyl estradiol (YASMIN) 3-0.03 MG tablet TAKE 1 TABLET BY MOUTH DAILY. 84 tablet 4  . hydrOXYzine (ATARAX/VISTARIL) 25 MG tablet TAKE 1 - 2 TABLETS BY MOUTH THREE TIMES DAILY AS NEEDED FOR ANXIETY 30 tablet 0  . omeprazole (PRILOSEC) 20 MG capsule Take 1 capsule (20 mg total) by mouth daily. 30 capsule 1  . rizatriptan (MAXALT-MLT) 5 MG disintegrating tablet Take 1 tablet (5 mg total) by mouth as needed for migraine. May repeat in 2 hours if needed. This is 90 day supply 30 tablet 2  . sertraline (ZOLOFT) 100 MG tablet TAKE 1 TABLET (100 MG TOTAL) BY MOUTH DAILY. *THIS TAKES PLACE OF SERTRALINE 50MG  2 TABS DAILY* 90 tablet 0  . topiramate (TOPAMAX) 25 MG tablet Take 1 tablet (25 mg total) by mouth daily. 90 tablet 1  .  drospirenone-ethinyl estradiol (YASMIN) 3-0.03 MG tablet Take 1 tablet by mouth daily. 84 tablet 4  . lisdexamfetamine (VYVANSE) 20 MG capsule Take 1 capsule (20 mg total) by mouth daily. (Patient not taking: Reported on 05/09/2020) 30 capsule 0   No facility-administered medications prior to visit.    Allergies  Allergen Reactions  . Intuniv [Guanfacine] Other (See Comments)    Excess sedation, Daytrana patch.   . Prochlorperazine Rash    ROS Review of Systems    Objective:    Physical Exam Vitals reviewed.  Constitutional:      Appearance: She is well-developed.  HENT:     Head: Normocephalic and atraumatic.     Right Ear: Tympanic membrane, ear canal and external ear normal.     Left Ear: Tympanic membrane, ear canal and external ear  normal.     Nose: Nose normal.     Mouth/Throat:     Mouth: Mucous membranes are moist.     Pharynx: Oropharynx is clear. No oropharyngeal exudate or posterior oropharyngeal erythema.  Eyes:     Conjunctiva/sclera: Conjunctivae normal.     Pupils: Pupils are equal, round, and reactive to light.  Neck:     Thyroid: No thyromegaly.  Cardiovascular:     Rate and Rhythm: Normal rate and regular rhythm.     Heart sounds: Normal heart sounds.  Pulmonary:     Effort: Pulmonary effort is normal.     Breath sounds: Normal breath sounds. No wheezing.  Abdominal:     General: Abdomen is flat. There is no distension.     Palpations: Abdomen is soft.  Musculoskeletal:     Cervical back: Neck supple.  Lymphadenopathy:     Cervical: No cervical adenopathy.  Skin:    General: Skin is warm and dry.     Coloration: Skin is not pale.  Neurological:     Mental Status: She is alert and oriented to person, place, and time.  Psychiatric:        Behavior: Behavior normal.     BP (!) 106/52   Pulse 66   Ht 5\' 1"  (1.549 m)   Wt 110 lb (49.9 kg)   SpO2 100%   BMI 20.78 kg/m  Wt Readings from Last 3 Encounters:  07/31/20 110 lb (49.9 kg) (15 %, Z= -1.03)*  05/02/20 108 lb (49 kg) (13 %, Z= -1.15)*  03/14/20 114 lb (51.7 kg) (23 %, Z= -0.72)*   * Growth percentiles are based on CDC (Girls, 2-20 Years) data.     Health Maintenance Due  Topic Date Due  . HIV Screening  Never done    There are no preventive care reminders to display for this patient.  Lab Results  Component Value Date   TSH 2.19 06/22/2016   Lab Results  Component Value Date   WBC 6.8 03/14/2020   HGB 14.0 03/14/2020   HCT 42.0 03/14/2020   MCV 87.5 03/14/2020   PLT 225 03/14/2020   Lab Results  Component Value Date   NA 142 03/14/2020   K 4.0 03/14/2020   CO2 22 03/14/2020   GLUCOSE 83 03/14/2020   BUN 9 03/14/2020   CREATININE 0.65 03/14/2020   BILITOT 0.3 03/14/2020   ALKPHOS 81 06/26/2015   AST 23  03/14/2020   ALT 21 03/14/2020   PROT 6.5 03/14/2020   ALBUMIN 4.0 06/26/2015   CALCIUM 9.0 03/14/2020   Lab Results  Component Value Date   CHOL 268 (H) 10/11/2019   Lab Results  Component Value Date   HDL 68 10/11/2019   Lab Results  Component Value Date   LDLCALC 168 (H) 10/11/2019   Lab Results  Component Value Date   TRIG 170 (H) 10/11/2019   Lab Results  Component Value Date   CHOLHDL 3.9 10/11/2019   Lab Results  Component Value Date   HGBA1C n 01/08/2015      Assessment & Plan:   Problem List Items Addressed This Visit      Cardiovascular and Mediastinum   Migraine without aura    No aura. Problem list updated.  She can use any birth control at this point. She is really happy with her current OCP.         Other Visit Diagnoses    Dental impaction    -  Primary     Dental impaction - form completed and given to patient.   No orders of the defined types were placed in this encounter.   Follow-up: No follow-ups on file.    Nani Gasser, MD

## 2020-07-31 NOTE — Assessment & Plan Note (Signed)
No aura. Problem list updated.  She can use any birth control at this point. She is really happy with her current OCP.

## 2020-08-06 ENCOUNTER — Other Ambulatory Visit: Payer: Self-pay

## 2020-08-06 ENCOUNTER — Telehealth (INDEPENDENT_AMBULATORY_CARE_PROVIDER_SITE_OTHER): Payer: 59 | Admitting: Psychiatry

## 2020-08-06 ENCOUNTER — Encounter (HOSPITAL_COMMUNITY): Payer: Self-pay | Admitting: Psychiatry

## 2020-08-06 VITALS — Wt 110.0 lb

## 2020-08-06 DIAGNOSIS — F418 Other specified anxiety disorders: Secondary | ICD-10-CM | POA: Diagnosis not present

## 2020-08-06 NOTE — Progress Notes (Signed)
Virtual Visit via Video Note  I connected with Brenda Mendoza on 08/06/20 at 11:00 AM EDT by a video enabled telemedicine application and verified that I am speaking with the correct person using two identifiers.  Location: Patient: Home Provider: Home Office   I discussed the limitations of evaluation and management by telemedicine and the availability of in person appointments. The patient expressed understanding and agreed to proceed.  History of Present Illness: Patient is evaluated by video session.  She admitted missing Zoloft for past few days and had a panic attack which she described sobbing, nervousness, chest hurting at work.  She has a history of missing the medication.  She understand not taking the medication can trigger her anxiety and nervousness.  Her hydroxyzine and Zoloft as prescribed by PCP.  Patient is not taking Vyvanse for more than 6 months.  She is not in school and does not feel she needed.  Her attention concentration is okay.  She continues to work part-time at [employer] 4 days a week.  Her job is going well.  She sleeps good.  She started relationship 2 months ago and she is happy things are going well.  Her sister is getting married soon and she is excited about it.  She continues to have contact with her mother, father and sister.  All her family member lives close by.  Patient lives with a roommate and she has 1 dog and 2 cats.  Patient denies any suicidal thoughts, paranoia, hallucination.  Her appetite is okay.   Past Psychiatric History: H/Oanxiety since school age.Sawpsychiatrist Dr. Milana Kidney for few months. Diagn[facility] with ADD [facility] by MeganCobaldoand given medicine but do not recall. Had tried Effexorbutcaused withdrawal symptoms,WellbutrinandViibryd did not work. No history of suicidal attempt, inpatient, mania, psychosis, PTSD or OCD symptoms  Psychiatric Specialty Exam: Physical Exam  Review of Systems   Weight 110 lb (49.9 kg).There is no height or weight on file to calculate BMI.  General Appearance: Casual  Eye Contact:  Good  Speech:  Clear and Coherent and Normal Rate  Volume:  Normal  Mood:  Euthymic  Affect:  Congruent  Thought Process:  Goal Directed  Orientation:  Full (Time, Place, and Person)  Thought Content:  Logical  Suicidal Thoughts:  No  Homicidal Thoughts:  No  Memory:  Immediate;   Good Recent;   Good Remote;   Good  Judgement:  Intact  Insight:  Fair  Psychomotor Activity:  Normal  Concentration:  Concentration: Good and Attention Span: Good  Recall:  Good  Fund of Knowledge:  Good  Language:  Good  Akathisia:  No  Handed:  Right  AIMS (if indicated):     Assets:  Communication Skills Desire for Improvement Housing Resilience Social Support Talents/Skills Transportation  ADL's:  Intact  Cognition:  WNL  Sleep:   ok      Assessment and Plan: Depression with anxiety.  Reinforced to take the medication as prescribed and daily to avoid having nervousness, anxiety and depression.  Patient promised that she will keep a reminder to take the medicine every day.  Her medicine Zoloft 125 mg and hydroxyzine 25 mg given by her primary care physician.  She is no more on Vyvanse.  I recommend to call us back if she feels worsening of the symptoms.  Patient like to have a follow-up with psychiatrist and we will do in 6 months to check on her.  She can call us earlier if she needs  an earlier appointment.  Follow Up Instructions:    I discussed the assessment and treatment plan with the patient. The patient was provided an opportunity to ask questions and all were answered. The patient agreed with the plan and demonstrated an understanding of the instructions.   The patient was advised to call back or seek an in-person evaluation if the symptoms worsen or if the condition fails to improve as anticipated.  I provided 17 minutes of non-face-to-face time during  this encounter.   Cleotis Nipper, MD

## 2020-08-07 ENCOUNTER — Other Ambulatory Visit (HOSPITAL_COMMUNITY): Payer: Self-pay

## 2020-08-07 MED ORDER — AMOXICILLIN 500 MG PO CAPS
ORAL_CAPSULE | ORAL | 0 refills | Status: DC
  Filled 2020-08-07: qty 21, 6d supply, fill #0

## 2020-08-07 MED ORDER — IBUPROFEN 600 MG PO TABS
ORAL_TABLET | ORAL | 0 refills | Status: DC
  Filled 2020-08-07: qty 30, 8d supply, fill #0

## 2020-09-04 ENCOUNTER — Encounter: Payer: Self-pay | Admitting: Family Medicine

## 2020-09-04 ENCOUNTER — Other Ambulatory Visit (HOSPITAL_BASED_OUTPATIENT_CLINIC_OR_DEPARTMENT_OTHER): Payer: Self-pay

## 2020-09-04 ENCOUNTER — Other Ambulatory Visit: Payer: Self-pay

## 2020-09-04 ENCOUNTER — Ambulatory Visit (INDEPENDENT_AMBULATORY_CARE_PROVIDER_SITE_OTHER): Payer: 59 | Admitting: Family Medicine

## 2020-09-04 DIAGNOSIS — G43009 Migraine without aura, not intractable, without status migrainosus: Secondary | ICD-10-CM | POA: Diagnosis not present

## 2020-09-04 DIAGNOSIS — G43109 Migraine with aura, not intractable, without status migrainosus: Secondary | ICD-10-CM | POA: Diagnosis not present

## 2020-09-04 MED ORDER — RIZATRIPTAN BENZOATE 5 MG PO TBDP
5.0000 mg | ORAL_TABLET | ORAL | 3 refills | Status: DC | PRN
  Filled 2020-09-04 – 2020-10-15 (×3): qty 30, 90d supply, fill #0
  Filled 2021-05-16: qty 30, 90d supply, fill #1

## 2020-09-04 MED ORDER — TOPIRAMATE 25 MG PO TABS
25.0000 mg | ORAL_TABLET | Freq: Every day | ORAL | 1 refills | Status: DC
  Filled 2020-09-04 – 2020-11-24 (×2): qty 90, 90d supply, fill #0
  Filled 2021-05-16: qty 90, 90d supply, fill #1

## 2020-09-04 NOTE — Progress Notes (Signed)
Established Patient Office Visit  Subjective:  Patient ID: Brenda Mendoza, female    DOB: 01-17-2001  Age: 20 y.o. MRN: [id number]  CC:  Chief Complaint  Patient presents with  . Migraine    HPI Brenda Mendoza presents for follow-up of migraine headaches.  Overall she is doing well she only gets about 2 migraines per year.  We did clarify that she does not get auras so she has been back on her combination birth control.  She feels like the Maxalt works most of the time that she takes it.  She does take the 25 mg of Topamax daily and says she does feel like it is helpful.  She wants to continue it for now because she feels like things are actually doing really well.  Past Medical History:  Diagnosis Date  . ADHD (attention deficit hyperactivity disorder)   . Migraine   . Strep throat    multiple times    Past Surgical History:  Procedure Laterality Date  . TONSILLECTOMY AND ADENOIDECTOMY  2009  . TYMPANOSTOMY TUBE PLACEMENT  2004    Family History  Problem Relation Age of Onset  . Depression Mother   . Migraines Mother   . ADD / ADHD Father   . Pancreatic cancer Maternal Grandmother 70  . Lung cancer Maternal Grandfather 48  . Heart disease Other   . Depression Sister     Social History   Socioeconomic History  . Marital status: Single    Spouse name: Not on file  . Number of children: Not on file  . Years of education: Not on file  . Highest education level: Not on file  Occupational History  . Occupation: Consulting civil engineer  Tobacco Use  . Smoking status: Passive Smoke Exposure - Never Smoker  . Smokeless tobacco: Never Used  . Tobacco comment: Outside smoking by mom  Vaping Use  . Vaping Use: Never used  Substance and Sexual Activity  . Alcohol use: No    Alcohol/week: 0.0 standard drinks  . Drug use: No  . Sexual activity: Not on file  Other Topics Concern  . Not on file  Social History Narrative   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 She likes to  horseback ride. Otherwise no regular exercise.   Social Determinants of Health   Financial Resource Strain: Not on file  Food Insecurity: Not on file  Transportation Needs: Not on file  Physical Activity: Not on file  Stress: Not on file  Social Connections: Not on file  Intimate Partner Violence: Not on file    Outpatient Medications Prior to Visit  Medication Sig Dispense Refill  . drospirenone-ethinyl estradiol (YASMIN) 3-0.03 MG tablet TAKE 1 TABLET BY MOUTH DAILY. 84 tablet 4  . hydrOXYzine (ATARAX/VISTARIL) 25 MG tablet TAKE 1 - 2 TABLETS BY MOUTH THREE TIMES DAILY AS NEEDED FOR ANXIETY 30 tablet 0  . sertraline (ZOLOFT) 100 MG tablet TAKE 1 TABLET (100 MG TOTAL) BY MOUTH DAILY. *THIS TAKES PLACE OF SERTRALINE 50MG  2 TABS DAILY* 90 tablet 0  . omeprazole (PRILOSEC) 20 MG capsule Take 1 capsule (20 mg total) by mouth daily. 30 capsule 1  . rizatriptan (MAXALT-MLT) 5 MG disintegrating tablet Take 1 tablet (5 mg total) by mouth as needed for migraine. May repeat in 2 hours if needed. This is 90 day supply 30 tablet 2  . topiramate (TOPAMAX) 25 MG tablet Take 1 tablet (25 mg total) by mouth daily. 90 tablet 1  . amoxicillin (  AMOXIL) 500 MG capsule Take 4 capsules by mouth one hour prior to oral surgery, then take 1 capsule by mouth every 8 hours until gone 21 capsule 0  . ibuprofen (ADVIL) 600 MG tablet Take 1 tablet by mouth every six hours 30 tablet 0   No facility-administered medications prior to visit.    Allergies  Allergen Reactions  . Intuniv [Guanfacine] Other (See Comments)    Excess sedation, Daytrana patch.   . Prochlorperazine Rash    ROS Review of Systems    Objective:    Physical Exam Constitutional:      Appearance: She is well-developed.  HENT:     Head: Normocephalic and atraumatic.  Cardiovascular:     Rate and Rhythm: Normal rate and regular rhythm.     Heart sounds: Normal heart sounds.  Pulmonary:     Effort: Pulmonary effort is normal.      Breath sounds: Normal breath sounds.  Skin:    General: Skin is warm and dry.  Neurological:     Mental Status: She is alert and oriented to person, place, and time.  Psychiatric:        Behavior: Behavior normal.     BP (!) 87/54   Pulse 83   Ht 5\' 1"  (1.549 m)   Wt 108 lb (49 kg)   SpO2 98%   BMI 20.41 kg/m  Wt Readings from Last 3 Encounters:  09/04/20 108 lb (49 kg) (12 %, Z= -1.17)*  07/31/20 110 lb (49.9 kg) (15 %, Z= -1.03)*  05/02/20 108 lb (49 kg) (13 %, Z= -1.15)*   * Growth percentiles are based on CDC (Girls, 2-20 Years) data.     Health Maintenance Due  Topic Date Due  . HIV Screening  Never done    There are no preventive care reminders to display for this patient.  Lab Results  Component Value Date   TSH 2.19 06/22/2016   Lab Results  Component Value Date   WBC 6.8 03/14/2020   HGB 14.0 03/14/2020   HCT 42.0 03/14/2020   MCV 87.5 03/14/2020   PLT 225 03/14/2020   Lab Results  Component Value Date   NA 142 03/14/2020   K 4.0 03/14/2020   CO2 22 03/14/2020   GLUCOSE 83 03/14/2020   BUN 9 03/14/2020   CREATININE 0.65 03/14/2020   BILITOT 0.3 03/14/2020   ALKPHOS 81 06/26/2015   AST 23 03/14/2020   ALT 21 03/14/2020   PROT 6.5 03/14/2020   ALBUMIN 4.0 06/26/2015   CALCIUM 9.0 03/14/2020   Lab Results  Component Value Date   CHOL 268 (H) 10/11/2019   Lab Results  Component Value Date   HDL 68 10/11/2019   Lab Results  Component Value Date   LDLCALC 168 (H) 10/11/2019   Lab Results  Component Value Date   TRIG 170 (H) 10/11/2019   Lab Results  Component Value Date   CHOLHDL 3.9 10/11/2019   Lab Results  Component Value Date   HGBA1C n 01/08/2015      Assessment & Plan:   Problem List Items Addressed This Visit      Cardiovascular and Mediastinum   Migraine without aura    Stable on current regimen may be a couple migraines per month that seem to be well controlled with Maxalt.  Plan is to follow-up in 6 months call  if any problems or flaring she wants to continue with the Topamax for now we did discuss at some point in the  future could consider weaning the medication.      Relevant Medications   topiramate (TOPAMAX) 25 MG tablet   rizatriptan (MAXALT-MLT) 5 MG disintegrating tablet    Other Visit Diagnoses    Migraine with aura and without status migrainosus, not intractable  (Chronic)      Relevant Medications   topiramate (TOPAMAX) 25 MG tablet   rizatriptan (MAXALT-MLT) 5 MG disintegrating tablet      Meds ordered this encounter  Medications  . topiramate (TOPAMAX) 25 MG tablet    Sig: Take 1 tablet (25 mg total) by mouth daily.    Dispense:  90 tablet    Refill:  1  . rizatriptan (MAXALT-MLT) 5 MG disintegrating tablet    Sig: Take 1 tablet (5 mg total) by mouth as needed for migraine. May repeat in 2 hours if needed. This is 90 day supply    Dispense:  30 tablet    Refill:  3    Follow-up: Return in about 6 months (around 03/06/2021) for Medications Mood.    Nani Gasser, MD

## 2020-09-04 NOTE — Assessment & Plan Note (Signed)
Stable on current regimen may be a couple migraines per month that seem to be well controlled with Maxalt.  Plan is to follow-up in 6 months call if any problems or flaring she wants to continue with the Topamax for now we did discuss at some point in the future could consider weaning the medication.

## 2020-09-05 ENCOUNTER — Other Ambulatory Visit (HOSPITAL_BASED_OUTPATIENT_CLINIC_OR_DEPARTMENT_OTHER): Payer: Self-pay

## 2020-09-12 ENCOUNTER — Other Ambulatory Visit (HOSPITAL_BASED_OUTPATIENT_CLINIC_OR_DEPARTMENT_OTHER): Payer: Self-pay

## 2020-10-15 ENCOUNTER — Other Ambulatory Visit (HOSPITAL_BASED_OUTPATIENT_CLINIC_OR_DEPARTMENT_OTHER): Payer: Self-pay

## 2020-11-24 ENCOUNTER — Other Ambulatory Visit: Payer: Self-pay | Admitting: Family Medicine

## 2020-11-24 ENCOUNTER — Other Ambulatory Visit (HOSPITAL_BASED_OUTPATIENT_CLINIC_OR_DEPARTMENT_OTHER): Payer: Self-pay

## 2020-11-24 MED ORDER — SERTRALINE HCL 100 MG PO TABS
100.0000 mg | ORAL_TABLET | Freq: Every day | ORAL | 0 refills | Status: DC
  Filled 2020-11-24: qty 90, 90d supply, fill #0

## 2020-11-25 ENCOUNTER — Other Ambulatory Visit (HOSPITAL_BASED_OUTPATIENT_CLINIC_OR_DEPARTMENT_OTHER): Payer: Self-pay

## 2020-11-26 ENCOUNTER — Other Ambulatory Visit (HOSPITAL_BASED_OUTPATIENT_CLINIC_OR_DEPARTMENT_OTHER): Payer: Self-pay

## 2020-11-26 MED ORDER — DROSPIRENONE-ETHINYL ESTRADIOL 3-0.03 MG PO TABS
1.0000 | ORAL_TABLET | Freq: Every day | ORAL | 0 refills | Status: DC
Start: 2020-11-26 — End: 2021-01-19
  Filled 2020-11-26: qty 28, 28d supply, fill #0

## 2021-01-19 ENCOUNTER — Encounter: Payer: Self-pay | Admitting: Family Medicine

## 2021-01-19 ENCOUNTER — Ambulatory Visit (INDEPENDENT_AMBULATORY_CARE_PROVIDER_SITE_OTHER): Payer: 59 | Admitting: Family Medicine

## 2021-01-19 ENCOUNTER — Other Ambulatory Visit (HOSPITAL_BASED_OUTPATIENT_CLINIC_OR_DEPARTMENT_OTHER): Payer: Self-pay

## 2021-01-19 VITALS — BP 95/53 | HR 53 | Ht 61.0 in | Wt 112.0 lb

## 2021-01-19 DIAGNOSIS — Z3041 Encounter for surveillance of contraceptive pills: Secondary | ICD-10-CM | POA: Diagnosis not present

## 2021-01-19 DIAGNOSIS — G43009 Migraine without aura, not intractable, without status migrainosus: Secondary | ICD-10-CM

## 2021-01-19 DIAGNOSIS — F900 Attention-deficit hyperactivity disorder, predominantly inattentive type: Secondary | ICD-10-CM

## 2021-01-19 DIAGNOSIS — F418 Other specified anxiety disorders: Secondary | ICD-10-CM | POA: Diagnosis not present

## 2021-01-19 DIAGNOSIS — Z113 Encounter for screening for infections with a predominantly sexual mode of transmission: Secondary | ICD-10-CM | POA: Diagnosis not present

## 2021-01-19 MED ORDER — AMPHETAMINE-DEXTROAMPHETAMINE 5 MG PO TABS
5.0000 mg | ORAL_TABLET | Freq: Every day | ORAL | 0 refills | Status: DC
  Filled 2021-01-19 – 2021-04-28 (×2): qty 30, 30d supply, fill #0

## 2021-01-19 MED ORDER — SERTRALINE HCL 100 MG PO TABS
100.0000 mg | ORAL_TABLET | Freq: Every day | ORAL | 1 refills | Status: DC
  Filled 2021-01-19 – 2021-05-16 (×2): qty 90, 90d supply, fill #0
  Filled 2021-10-18: qty 90, 90d supply, fill #1

## 2021-01-19 MED ORDER — DROSPIRENONE-ETHINYL ESTRADIOL 3-0.03 MG PO TABS
1.0000 | ORAL_TABLET | Freq: Every day | ORAL | 3 refills | Status: DC
  Filled 2021-01-19 – 2021-02-10 (×2): qty 84, 84d supply, fill #0
  Filled 2021-05-16: qty 84, 84d supply, fill #1
  Filled 2021-10-18: qty 84, 84d supply, fill #2

## 2021-01-19 MED ORDER — SERTRALINE HCL 25 MG PO TABS
ORAL_TABLET | ORAL | 1 refills | Status: DC
  Filled 2021-01-19 – 2021-02-10 (×2): qty 90, 90d supply, fill #0

## 2021-01-19 NOTE — Progress Notes (Signed)
Established Patient Office Visit  Subjective:  Patient ID: Brenda Mendoza, female    DOB: 08/14/2000  Age: 20 y.o. MRN: [id number]  CC:  Chief Complaint  Patient presents with   ADD   Migraine   Contraception     HPI AQSA SENSABAUGH presents for   ADD - Reports symptoms are well controlled on current regime. Denies any problems with insomnia, chest pain, palpitations, or SOB.    F/U migraines -she is actually doing really well she says she has a tension headache you are there but is actually been quite sometime since she is had a migraine.  F/U Depression with anxiety -she feels like things have been a little bit more stressful she says she has not had the extra 25 mg or trying to take with the 100s for about 2 months and has noticed the difference she definitely wants to get back on the 25's in addition to her 100 mg dose daily.  She is happy with her birth control.  She is currently sexually active with another female.  Past Medical History:  Diagnosis Date   ADHD (attention deficit hyperactivity disorder)    Migraine    Strep throat    multiple times    Past Surgical History:  Procedure Laterality Date   TONSILLECTOMY AND ADENOIDECTOMY  2009   TYMPANOSTOMY TUBE PLACEMENT  2004    Family History  Problem Relation Age of Onset   Depression Mother    Migraines Mother    ADD / ADHD Father    Pancreatic cancer Maternal Grandmother 53   Lung cancer Maternal Grandfather 48   Heart disease Other    Depression Sister     Social History   Socioeconomic History   Marital status: Single    Spouse name: Not on file   Number of children: Not on file   Years of education: Not on file   Highest education level: Not on file  Occupational History   Occupation: student  Tobacco Use   Smoking status: Never    Passive exposure: Yes   Smokeless tobacco: Never   Tobacco comments:    Outside smoking by mom  Vaping Use   Vaping Use: Never used  Substance and Sexual  Activity   Alcohol use: No    Alcohol/week: 0.0 standard drinks   Drug use: No   Sexual activity: Not on file  Other Topics Concern   Not on file  Social History Narrative   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 She likes to horseback ride. Otherwise no regular exercise.   Social Determinants of Health   Financial Resource Strain: Not on file  Food Insecurity: Not on file  Transportation Needs: Not on file  Physical Activity: Not on file  Stress: Not on file  Social Connections: Not on file  Intimate Partner Violence: Not on file    Outpatient Medications Prior to Visit  Medication Sig Dispense Refill   rizatriptan (MAXALT-MLT) 5 MG disintegrating tablet Take 1 tablet (5 mg total) by mouth as needed for migraine. May repeat in 2 hours if needed. This is 90 day supply 30 tablet 3   topiramate (TOPAMAX) 25 MG tablet Take 1 tablet (25 mg total) by mouth daily. 90 tablet 1   drospirenone-ethinyl estradiol (YASMIN) 3-0.03 MG tablet Take 1 tablet by mouth daily. **PATIENT NEEDS OFFICE VISIT FOR ADDITIONAL REFILLS** 28 tablet 0   hydrOXYzine (ATARAX/VISTARIL) 25 MG tablet TAKE 1 - 2 TABLETS BY MOUTH THREE TIMES  DAILY AS NEEDED FOR ANXIETY 30 tablet 0   sertraline (ZOLOFT) 100 MG tablet Take 1 tablet (100 mg total) by mouth daily. 90 tablet 0   No facility-administered medications prior to visit.    Allergies  Allergen Reactions   Intuniv [Guanfacine] Other (See Comments)    Excess sedation, Daytrana patch.    Prochlorperazine Rash    ROS Review of Systems    Objective:    Physical Exam Constitutional:      Appearance: Normal appearance. She is well-developed.  HENT:     Head: Normocephalic and atraumatic.  Cardiovascular:     Rate and Rhythm: Normal rate and regular rhythm.     Heart sounds: Normal heart sounds.  Pulmonary:     Effort: Pulmonary effort is normal.     Breath sounds: Normal breath sounds.  Skin:    General: Skin is warm and dry.  Neurological:      Mental Status: She is alert and oriented to person, place, and time.  Psychiatric:        Behavior: Behavior normal.    BP (!) 95/53   Pulse (!) 53   Ht 5\' 1"  (1.549 m)   Wt 112 lb (50.8 kg)   LMP 12/13/2020   SpO2 100%   BMI 21.16 kg/m  Wt Readings from Last 3 Encounters:  01/19/21 112 lb (50.8 kg)  09/04/20 108 lb (49 kg) (12 %, Z= -1.17)*  07/31/20 110 lb (49.9 kg) (15 %, Z= -1.03)*   * Growth percentiles are based on CDC (Girls, 2-20 Years) data.     Health Maintenance Due  Topic Date Due   HIV Screening  Never done    There are no preventive care reminders to display for this patient.  Lab Results  Component Value Date   TSH 2.19 06/22/2016   Lab Results  Component Value Date   WBC 6.8 03/14/2020   HGB 14.0 03/14/2020   HCT 42.0 03/14/2020   MCV 87.5 03/14/2020   PLT 225 03/14/2020   Lab Results  Component Value Date   NA 142 03/14/2020   K 4.0 03/14/2020   CO2 22 03/14/2020   GLUCOSE 83 03/14/2020   BUN 9 03/14/2020   CREATININE 0.65 03/14/2020   BILITOT 0.3 03/14/2020   ALKPHOS 81 06/26/2015   AST 23 03/14/2020   ALT 21 03/14/2020   PROT 6.5 03/14/2020   ALBUMIN 4.0 06/26/2015   CALCIUM 9.0 03/14/2020   Lab Results  Component Value Date   CHOL 268 (H) 10/11/2019   Lab Results  Component Value Date   HDL 68 10/11/2019   Lab Results  Component Value Date   LDLCALC 168 (H) 10/11/2019   Lab Results  Component Value Date   TRIG 170 (H) 10/11/2019   Lab Results  Component Value Date   CHOLHDL 3.9 10/11/2019   Lab Results  Component Value Date   HGBA1C n 01/08/2015      Assessment & Plan:   Problem List Items Addressed This Visit       Cardiovascular and Mediastinum   Migraine without aura - Primary    Doing well on her current regimen      Relevant Medications   sertraline (ZOLOFT) 25 MG tablet   sertraline (ZOLOFT) 100 MG tablet     Other   Depression with anxiety    We will add a 25 mg back to the Topamax there  was an error at the pharmacy were all the medications repeated and I think  we accidentally took it off and we were trying to clean up her chart medication list.  We will add that back since she definitely notices a big difference.  PHQ-9 score of 16 today and GAD-7 score of 6.  Follow-up in 6 months call sooner if any problems or concerns.  She still really struggling with her memory she was evaluated and they recommended that she start Vyvanse.  But she did not like the way it made her feel.  And she would rather have something a little shorter acting in case she forgets to take it early.  We will give 5 mg of low-dose Adderall instead.      Relevant Medications   sertraline (ZOLOFT) 25 MG tablet   sertraline (ZOLOFT) 100 MG tablet   ADD (attention deficit disorder)   Other Visit Diagnoses     Screen for STD (sexually transmitted disease)       Relevant Orders   C. trachomatis/N. gonorrhoeae RNA   Encounter for surveillance of contraceptive pills           Meds ordered this encounter  Medications   sertraline (ZOLOFT) 25 MG tablet    Sig: TAKE 1 TABLET BY MOUTH DAILY **TAKE AT THE SAME TIME AS SERTRALINE 100MG **    Dispense:  90 tablet    Refill:  1   drospirenone-ethinyl estradiol (YASMIN) 3-0.03 MG tablet    Sig: Take 1 tablet by mouth daily.    Dispense:  91 tablet    Refill:  3   sertraline (ZOLOFT) 100 MG tablet    Sig: Take 1 tablet (100 mg total) by mouth daily.    Dispense:  90 tablet    Refill:  1   amphetamine-dextroamphetamine (ADDERALL) 5 MG tablet    Sig: Take 1 tablet (5 mg total) by mouth daily.    Dispense:  30 tablet    Refill:  0     Follow-up: Return in about 3 months (around 04/21/2021) for for mood and migraines and new start Adderall.    04/23/2021, MD

## 2021-01-19 NOTE — Assessment & Plan Note (Signed)
We will add a 25 mg back to the Topamax there was an error at the pharmacy were all the medications repeated and I think we accidentally took it off and we were trying to clean up her chart medication list.  We will add that back since she definitely notices a big difference.  PHQ-9 score of 16 today and GAD-7 score of 6.  Follow-up in 6 months call sooner if any problems or concerns.  She still really struggling with her memory she was evaluated and they recommended that she start Vyvanse.  But she did not like the way it made her feel.  And she would rather have something a little shorter acting in case she forgets to take it early.  We will give 5 mg of low-dose Adderall instead.

## 2021-01-19 NOTE — Assessment & Plan Note (Signed)
Doing well on her current regimen

## 2021-01-21 LAB — C. TRACHOMATIS/N. GONORRHOEAE RNA
C. trachomatis RNA, TMA: NOT DETECTED
N. gonorrhoeae RNA, TMA: NOT DETECTED

## 2021-01-21 NOTE — Progress Notes (Signed)
Negative for GC and chlamydia

## 2021-01-27 ENCOUNTER — Other Ambulatory Visit (HOSPITAL_BASED_OUTPATIENT_CLINIC_OR_DEPARTMENT_OTHER): Payer: Self-pay

## 2021-02-05 ENCOUNTER — Encounter (HOSPITAL_COMMUNITY): Payer: Self-pay | Admitting: Psychiatry

## 2021-02-05 ENCOUNTER — Telehealth (HOSPITAL_BASED_OUTPATIENT_CLINIC_OR_DEPARTMENT_OTHER): Payer: 59 | Admitting: Psychiatry

## 2021-02-05 ENCOUNTER — Other Ambulatory Visit: Payer: Self-pay

## 2021-02-05 VITALS — Wt 112.0 lb

## 2021-02-05 DIAGNOSIS — F418 Other specified anxiety disorders: Secondary | ICD-10-CM

## 2021-02-05 DIAGNOSIS — F900 Attention-deficit hyperactivity disorder, predominantly inattentive type: Secondary | ICD-10-CM | POA: Diagnosis not present
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
## 2021-02-10 ENCOUNTER — Other Ambulatory Visit (HOSPITAL_BASED_OUTPATIENT_CLINIC_OR_DEPARTMENT_OTHER): Payer: Self-pay

## 2021-03-12 ENCOUNTER — Ambulatory Visit: Payer: 59 | Admitting: Family Medicine

## 2021-03-12 NOTE — Progress Notes (Deleted)
Established Patient Office Visit  Subjective:  Patient ID: Brenda Mendoza, female    DOB: Jun 30, 2000  Age: 20 y.o. MRN: [id number]  CC: No chief complaint on file.   HPI Brenda Mendoza presents for follow-up ADHD.  I last saw her couple of months ago and we discussed just using a short acting Adderall to take more as needed.  She did not want anything long-acting in case she forgot to take it.  And she had recently tried Vyvanse and had some side effects.  Past Medical History:  Diagnosis Date   ADHD (attention deficit hyperactivity disorder)    Migraine    Strep throat    multiple times    Past Surgical History:  Procedure Laterality Date   TONSILLECTOMY AND ADENOIDECTOMY  2009   TYMPANOSTOMY TUBE PLACEMENT  2004    Family History  Problem Relation Age of Onset   Depression Mother    Migraines Mother    ADD / ADHD Father    Pancreatic cancer Maternal Grandmother 19   Lung cancer Maternal Grandfather 29   Heart disease Other    Depression Sister     Social History   Socioeconomic History   Marital status: Single    Spouse name: Not on file   Number of children: Not on file   Years of education: Not on file   Highest education level: Not on file  Occupational History   Occupation: student  Tobacco Use   Smoking status: Never    Passive exposure: Yes   Smokeless tobacco: Never   Tobacco comments:    Outside smoking by mom  Vaping Use   Vaping Use: Never used  Substance and Sexual Activity   Alcohol use: No    Alcohol/week: 0.0 standard drinks   Drug use: No   Sexual activity: Not on file  Other Topics Concern   Not on file  Social History Narrative   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 She likes to horseback ride. Otherwise no regular exercise.   Social Determinants of Health   Financial Resource Strain: Not on file  Food Insecurity: Not on file  Transportation Needs: Not on file  Physical Activity: Not on file  Stress: Not on file   Social Connections: Not on file  Intimate Partner Violence: Not on file    Outpatient Medications Prior to Visit  Medication Sig Dispense Refill   amphetamine-dextroamphetamine (ADDERALL) 5 MG tablet Take 1 tablet (5 mg total) by mouth daily. 30 tablet 0   drospirenone-ethinyl estradiol (YASMIN) 3-0.03 MG tablet Take 1 tablet by mouth daily. 84 tablet 3   rizatriptan (MAXALT-MLT) 5 MG disintegrating tablet Take 1 tablet (5 mg total) by mouth as needed for migraine. May repeat in 2 hours if needed. This is 90 day supply 30 tablet 3   sertraline (ZOLOFT) 100 MG tablet Take 1 tablet (100 mg total) by mouth daily. 90 tablet 1   sertraline (ZOLOFT) 25 MG tablet TAKE 1 TABLET BY MOUTH DAILY **TAKE AT THE SAME TIME AS SERTRALINE 100MG ** 90 tablet 1   topiramate (TOPAMAX) 25 MG tablet Take 1 tablet (25 mg total) by mouth daily. 90 tablet 1   No facility-administered medications prior to visit.    Allergies  Allergen Reactions   Intuniv [Guanfacine] Other (See Comments)    Excess sedation, Daytrana patch.    Prochlorperazine Rash    ROS Review of Systems    Objective:    Physical Exam  There were no  vitals taken for this visit. Wt Readings from Last 3 Encounters:  01/19/21 112 lb (50.8 kg)  09/04/20 108 lb (49 kg) (12 %, Z= -1.17)*  07/31/20 110 lb (49.9 kg) (15 %, Z= -1.03)*   * Growth percentiles are based on CDC (Girls, 2-20 Years) data.     Health Maintenance Due  Topic Date Due   HIV Screening  Never done   COVID-19 Vaccine (4 - Booster for Moderna series) 05/06/2020    There are no preventive care reminders to display for this patient.  Lab Results  Component Value Date   TSH 2.19 06/22/2016   Lab Results  Component Value Date   WBC 6.8 03/14/2020   HGB 14.0 03/14/2020   HCT 42.0 03/14/2020   MCV 87.5 03/14/2020   PLT 225 03/14/2020   Lab Results  Component Value Date   NA 142 03/14/2020   K 4.0 03/14/2020   CO2 22 03/14/2020   GLUCOSE 83 03/14/2020    BUN 9 03/14/2020   CREATININE 0.65 03/14/2020   BILITOT 0.3 03/14/2020   ALKPHOS 81 06/26/2015   AST 23 03/14/2020   ALT 21 03/14/2020   PROT 6.5 03/14/2020   ALBUMIN 4.0 06/26/2015   CALCIUM 9.0 03/14/2020   Lab Results  Component Value Date   CHOL 268 (H) 10/11/2019   Lab Results  Component Value Date   HDL 68 10/11/2019   Lab Results  Component Value Date   LDLCALC 168 (H) 10/11/2019   Lab Results  Component Value Date   TRIG 170 (H) 10/11/2019   Lab Results  Component Value Date   CHOLHDL 3.9 10/11/2019   Lab Results  Component Value Date   HGBA1C n 01/08/2015      Assessment & Plan:   Problem List Items Addressed This Visit   None   No orders of the defined types were placed in this encounter.   Follow-up: No follow-ups on file.    Nani Gasser, MD

## 2021-04-21 ENCOUNTER — Encounter: Payer: Self-pay | Admitting: Family Medicine

## 2021-04-21 ENCOUNTER — Ambulatory Visit: Payer: 59 | Admitting: Family Medicine

## 2021-04-21 ENCOUNTER — Other Ambulatory Visit: Payer: Self-pay

## 2021-04-21 VITALS — BP 103/52 | HR 60 | Resp 16 | Ht 61.0 in | Wt 113.0 lb

## 2021-04-21 DIAGNOSIS — F418 Other specified anxiety disorders: Secondary | ICD-10-CM

## 2021-04-21 DIAGNOSIS — F900 Attention-deficit hyperactivity disorder, predominantly inattentive type: Secondary | ICD-10-CM

## 2021-04-21 DIAGNOSIS — G43009 Migraine without aura, not intractable, without status migrainosus: Secondary | ICD-10-CM | POA: Diagnosis not present

## 2021-04-21 NOTE — Progress Notes (Signed)
Patient never picked up prescription for Adderall.

## 2021-04-21 NOTE — Assessment & Plan Note (Signed)
Currently well controlled.  

## 2021-04-21 NOTE — Assessment & Plan Note (Signed)
Doing well on current regimen.  Panic attacks have pretty much disappeared at this point.  Still struggling some with some depressive and anxiety symptoms.  PHQ-9 score of 11 and GAD-7 score of 9.  We discussed options we could consider a trial of adding Wellbutrin.  Considering a low-dose mood stabilizer, or focus on really treating her ADHD to see if that really helps Brenda Mendoza some of the things that she still struggling with.

## 2021-04-21 NOTE — Assessment & Plan Note (Signed)
Encouraged her to give the Adderall to try I did write for a low short acting dose so that if she experiences any unwanted side effects and it should at least be temporary she can always try it on a day where she is not going to work.

## 2021-04-21 NOTE — Progress Notes (Signed)
Established Patient Office Visit  Subjective:  Patient ID: Brenda Mendoza, female    DOB: [birth year]  Age: 21 y.o. MRN: [id number]  CC:  Chief Complaint  Patient presents with   Anxiety    Depression. Follow up. Patient states Zoloft is helping with anxiety/depression     HPI Brenda Mendoza presents for   Follow-up of mood-overall she is doing really well on the Zoloft she feels like it makes a big difference she just really still is struggling with some depressive symptoms that she wish she has could be a little better than they are.  She has not had any major side effects on the sertraline.  She does report she occasionally misses her medication.  When that happens she will sometimes then feel nauseated in the middle the night it when she does take it.  She says she has not had unprovoked panic attacks in a really long time.  ADHD-she did not start the Adderall she was a little fearful about potential side effects and so held off on taking it.   Past Medical History:  Diagnosis Date   ADHD (attention deficit hyperactivity disorder)    Migraine    Strep throat    multiple times    Past Surgical History:  Procedure Laterality Date   TONSILLECTOMY AND ADENOIDECTOMY  2009   TYMPANOSTOMY TUBE PLACEMENT  2004    Family History  Problem Relation Age of Onset   Depression Mother    Migraines Mother    ADD / ADHD Father    Pancreatic cancer Maternal Grandmother 67   Lung cancer Maternal Grandfather 52   Heart disease Other    Depression Sister     Social History   Socioeconomic History   Marital status: Single    Spouse name: Not on file   Number of children: Not on file   Years of education: Not on file   Highest education level: Not on file  Occupational History   Occupation: student  Tobacco Use   Smoking status: Never    Passive exposure: Yes   Smokeless tobacco: Never   Tobacco comments:    Outside smoking by mom  Vaping Use   Vaping Use: Never used   Substance and Sexual Activity   Alcohol use: No    Alcohol/week: 0.0 standard drinks   Drug use: No   Sexual activity: Not on file  Other Topics Concern   Not on file  Social History Narrative   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 She likes to horseback ride. Otherwise no regular exercise.   Social Determinants of Health   Financial Resource Strain: Not on file  Food Insecurity: Not on file  Transportation Needs: Not on file  Physical Activity: Not on file  Stress: Not on file  Social Connections: Not on file  Intimate Partner Violence: Not on file    Outpatient Medications Prior to Visit  Medication Sig Dispense Refill   drospirenone-ethinyl estradiol (YASMIN) 3-0.03 MG tablet Take 1 tablet by mouth daily. 84 tablet 3   rizatriptan (MAXALT-MLT) 5 MG disintegrating tablet Take 1 tablet (5 mg total) by mouth as needed for migraine. May repeat in 2 hours if needed. This is 90 day supply 30 tablet 3   sertraline (ZOLOFT) 100 MG tablet Take 1 tablet (100 mg total) by mouth daily. 90 tablet 1   sertraline (ZOLOFT) 25 MG tablet TAKE 1 TABLET BY MOUTH DAILY **TAKE AT THE SAME TIME AS SERTRALINE 100MG ** 90  tablet 1   topiramate (TOPAMAX) 25 MG tablet Take 1 tablet (25 mg total) by mouth daily. 90 tablet 1   amphetamine-dextroamphetamine (ADDERALL) 5 MG tablet Take 1 tablet (5 mg total) by mouth daily. (Patient not taking: Reported on 04/21/2021) 30 tablet 0   No facility-administered medications prior to visit.    Allergies  Allergen Reactions   Intuniv [Guanfacine] Other (See Comments)    Excess sedation, Daytrana patch.    Vyvanse [Lisdexamfetamine] Other (See Comments)   Prochlorperazine Rash    ROS Review of Systems    Objective:    Physical Exam Constitutional:      Appearance: Normal appearance. She is well-developed.  HENT:     Head: Normocephalic and atraumatic.  Cardiovascular:     Rate and Rhythm: Normal rate and regular rhythm.     Heart sounds:  Normal heart sounds.  Pulmonary:     Effort: Pulmonary effort is normal.     Breath sounds: Normal breath sounds.  Skin:    General: Skin is warm and dry.  Neurological:     Mental Status: She is alert and oriented to person, place, and time.  Psychiatric:        Behavior: Behavior normal.    BP (!) 103/52    Pulse 60    Resp 16    Ht 5\' 1"  (1.549 m)    Wt 113 lb (51.3 kg)    LMP 04/17/2021    SpO2 100%    BMI 21.35 kg/m  Wt Readings from Last 3 Encounters:  04/21/21 113 lb (51.3 kg)  01/19/21 112 lb (50.8 kg)  09/04/20 108 lb (49 kg) (12 %, Z= -1.17)*   * Growth percentiles are based on CDC (Girls, 2-20 Years) data.     Health Maintenance Due  Topic Date Due   HIV Screening  Never done   COVID-19 Vaccine (4 - Booster for Moderna series) 05/06/2020    There are no preventive care reminders to display for this patient.  Lab Results  Component Value Date   TSH 2.19 06/22/2016   Lab Results  Component Value Date   WBC 6.8 03/14/2020   HGB 14.0 03/14/2020   HCT 42.0 03/14/2020   MCV 87.5 03/14/2020   PLT 225 03/14/2020   Lab Results  Component Value Date   NA 142 03/14/2020   K 4.0 03/14/2020   CO2 22 03/14/2020   GLUCOSE 83 03/14/2020   BUN 9 03/14/2020   CREATININE 0.65 03/14/2020   BILITOT 0.3 03/14/2020   ALKPHOS 81 06/26/2015   AST 23 03/14/2020   ALT 21 03/14/2020   PROT 6.5 03/14/2020   ALBUMIN 4.0 06/26/2015   CALCIUM 9.0 03/14/2020   Lab Results  Component Value Date   CHOL 268 (H) 10/11/2019   Lab Results  Component Value Date   HDL 68 10/11/2019   Lab Results  Component Value Date   LDLCALC 168 (H) 10/11/2019   Lab Results  Component Value Date   TRIG 170 (H) 10/11/2019   Lab Results  Component Value Date   CHOLHDL 3.9 10/11/2019   Lab Results  Component Value Date   HGBA1C n 01/08/2015      Assessment & Plan:   Problem List Items Addressed This Visit       Cardiovascular and Mediastinum   Migraine without aura     Currently well controlled.        Other   Depression with anxiety    Doing well on current regimen.  Panic attacks have pretty much disappeared at this point.  Still struggling some with some depressive and anxiety symptoms.  PHQ-9 score of 11 and GAD-7 score of 9.  We discussed options we could consider a trial of adding Wellbutrin.  Considering a low-dose mood stabilizer, or focus on really treating her ADHD to see if that really helps Carver Fila some of the things that she still struggling with.      ADD (attention deficit disorder) - Primary    Encouraged her to give the Adderall to try I did write for a low short acting dose so that if she experiences any unwanted side effects and it should at least be temporary she can always try it on a day where she is not going to work.       No orders of the defined types were placed in this encounter.   Follow-up: Return in about 3 months (around 07/20/2021) for Mood and ADHD.    Beatrice Lecher, MD

## 2021-04-29 ENCOUNTER — Other Ambulatory Visit (HOSPITAL_BASED_OUTPATIENT_CLINIC_OR_DEPARTMENT_OTHER): Payer: Self-pay

## 2021-05-18 ENCOUNTER — Other Ambulatory Visit (HOSPITAL_BASED_OUTPATIENT_CLINIC_OR_DEPARTMENT_OTHER): Payer: Self-pay

## 2021-05-20 ENCOUNTER — Other Ambulatory Visit (HOSPITAL_BASED_OUTPATIENT_CLINIC_OR_DEPARTMENT_OTHER): Payer: Self-pay

## 2021-05-20 ENCOUNTER — Ambulatory Visit: Payer: 59 | Admitting: Physician Assistant

## 2021-05-20 ENCOUNTER — Other Ambulatory Visit: Payer: Self-pay

## 2021-05-20 ENCOUNTER — Encounter: Payer: Self-pay | Admitting: Physician Assistant

## 2021-05-20 VITALS — BP 103/70 | HR 76 | Ht 61.0 in | Wt 109.0 lb

## 2021-05-20 DIAGNOSIS — Z8711 Personal history of peptic ulcer disease: Secondary | ICD-10-CM | POA: Diagnosis not present

## 2021-05-20 DIAGNOSIS — R5381 Other malaise: Secondary | ICD-10-CM | POA: Diagnosis not present

## 2021-05-20 DIAGNOSIS — R1013 Epigastric pain: Secondary | ICD-10-CM | POA: Diagnosis not present

## 2021-05-20 DIAGNOSIS — R110 Nausea: Secondary | ICD-10-CM | POA: Diagnosis not present

## 2021-05-20 DIAGNOSIS — Z8719 Personal history of other diseases of the digestive system: Secondary | ICD-10-CM | POA: Insufficient documentation

## 2021-05-20 MED ORDER — HYOSCYAMINE SULFATE 0.125 MG PO TBDP
0.1250 mg | ORAL_TABLET | Freq: Once | ORAL | Status: AC
  Administered 2021-05-20: 0.125 mg via SUBLINGUAL

## 2021-05-20 MED ORDER — ALUM & MAG HYDROXIDE-SIMETH 200-200-20 MG/5ML PO SUSP
30.0000 mL | Freq: Once | ORAL | Status: AC
  Administered 2021-05-20: 30 mL via ORAL

## 2021-05-20 MED ORDER — OMEPRAZOLE 40 MG PO CPDR
40.0000 mg | DELAYED_RELEASE_CAPSULE | Freq: Every day | ORAL | 1 refills | Status: DC
Start: 2021-05-20 — End: 2022-11-02
  Filled 2021-05-20: qty 30, 30d supply, fill #0
  Filled 2021-11-26: qty 30, 30d supply, fill #1

## 2021-05-20 MED ORDER — LIDOCAINE VISCOUS HCL 2 % MT SOLN
15.0000 mL | Freq: Once | OROMUCOSAL | Status: AC
  Administered 2021-05-20: 15 mL via OROMUCOSAL

## 2021-05-20 NOTE — Progress Notes (Signed)
Subjective:    Patient ID: Brenda Mendoza, female    DOB: 10/21/2000, 20 y.o.   MRN: [id number]  HPI The patient is a well appearing 21 year old female presenting with chief complaint of fatigue and nausea beginning Saturday night (4 days ago). She reports experiencing intermittent chills Sunday that have resolved. Her symptoms are worsened by activity and relieved with rest. She denies fever, cough, SOB, rhinorrhea, sore throat. No known sick contacts. LMP was 2 weeks ago and typical for the patient. She is currently sexually active with a trans partner and is using Yaz as contraception. 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 She is having some epigastric discomfort and food does seem to make worse. She does have a hx of gastric ulcers. Denies any melena or hematochezia.   She did have a negative covid test.   .. Active Ambulatory Problems    Diagnosis Date Noted   ADD (attention deficit disorder) 03/10/2012   Depression with anxiety 05/14/2013   Keratosis pilaris 07/23/2013   Elevated hemoglobin (HCC) 07/10/2014   Migraine without aura 09/20/2014   Episodic tension-type headache, not intractable 09/20/2014   History of gastric ulcer 05/20/2021   Resolved Ambulatory Problems    Diagnosis Date Noted   Toxic effect of venom(989.5) 10/05/2009   Mood disorder (HCC) 05/12/2012   Migraine headache 07/23/2013   Insomnia 04/11/2014   Abdominal pain, chronic, epigastric 07/09/2014   Cough 08/20/2014   Migraine without aura and without status migrainosus, not intractable 09/20/2014   Costochondritis 12/10/2014   No energy 06/23/2016   Acute gastritis without hemorrhage 03/17/2020   Past Medical History:  Diagnosis Date   ADHD (attention deficit hyperactivity disorder)    Migraine    Strep throat         Review of Systems  Constitutional:  Positive for chills (only on Sunday) and fatigue. Negative for fever.  HENT:  Negative for congestion, ear  pain, rhinorrhea, sinus pressure and sore throat.   Respiratory: Negative.  Negative for cough, shortness of breath and wheezing.   Cardiovascular:  Negative for chest pain.  Gastrointestinal:  Positive for nausea. Negative for abdominal pain, blood in stool and diarrhea.  Musculoskeletal:  Negative for arthralgias and myalgias.  Skin:  Negative for rash.  Neurological:  Positive for headaches.  Psychiatric/Behavioral: Negative.        Objective:   Physical Exam Constitutional:      General: She is not in acute distress.    Appearance: Normal appearance. She is normal weight. She is not ill-appearing.  HENT:     Head: Normocephalic and atraumatic.     Right Ear: Tympanic membrane and ear canal normal.     Left Ear: Tympanic membrane and ear canal normal.     Nose: No congestion or rhinorrhea.     Mouth/Throat:     Mouth: Mucous membranes are moist.     Pharynx: Oropharynx is clear. No oropharyngeal exudate.  Eyes:     General: No scleral icterus. Cardiovascular:     Rate and Rhythm: Normal rate and regular rhythm.     Heart sounds: Normal heart sounds. No murmur heard.   No friction rub. No gallop.  Pulmonary:     Effort: Pulmonary effort is normal.     Breath sounds: Normal breath sounds. No wheezing or rhonchi.  Abdominal:     General: Abdomen is flat.     Palpations: Abdomen is soft.     Tenderness: There  is no abdominal tenderness. There is no guarding.  Musculoskeletal:     Cervical back: Normal range of motion and neck supple.  Lymphadenopathy:     Cervical: No cervical adenopathy.  Skin:    General: Skin is warm and dry.  Neurological:     Mental Status: She is alert and oriented to person, place, and time.  Psychiatric:        Mood and Affect: Mood normal.        Behavior: Behavior normal.        Thought Content: Thought content normal.          Assessment & Plan:  Marland KitchenMarland KitchenCheray was seen today for fatigue.  Diagnoses and all orders for this  visit:  Nausea -     omeprazole (PRILOSEC) 40 MG capsule; Take 1 capsule (40 mg total) by mouth daily. -     CBC with Differential/Platelet -     COMPLETE METABOLIC PANEL WITH GFR -     Lipase -     TSH -     Fe+TIBC+Fer  Epigastric discomfort -     omeprazole (PRILOSEC) 40 MG capsule; Take 1 capsule (40 mg total) by mouth daily. -     CBC with Differential/Platelet -     COMPLETE METABOLIC PANEL WITH GFR -     Lipase -     TSH -     Fe+TIBC+Fer -     hyoscyamine (ANASPAZ) disintergrating tablet 0.125 mg -     alum & mag hydroxide-simeth (MAALOX/MYLANTA) 200-200-20 MG/5ML suspension 30 mL -     lidocaine (XYLOCAINE) 2 % viscous mouth solution 15 mL  Malaise -     omeprazole (PRILOSEC) 40 MG capsule; Take 1 capsule (40 mg total) by mouth daily. -     CBC with Differential/Platelet -     COMPLETE METABOLIC PANEL WITH GFR -     Lipase -     TSH -     Fe+TIBC+Fer  History of gastric ulcer -     omeprazole (PRILOSEC) 40 MG capsule; Take 1 capsule (40 mg total) by mouth daily.   Unclear etiology.  No URI symptoms to suggest flu or covid.  Tested negative for covid at home with home test.  Will get CBC/CMP/lipase Concern for gastritis with hx of ulcer and epigastric discomfort and nausea GI cocktail given in clinic Omeprazole to start if labs are normal Follow up with PCP in 2-4 weeks.  Written out of work for this week through today.  Follow up if symptoms change or worsen.

## 2021-05-20 NOTE — Patient Instructions (Addendum)
GI cocktail given today.  Will get labs.  Start omeprazole daily if nausea and epigastric discomfort continues.

## 2021-05-21 LAB — CBC WITH DIFFERENTIAL/PLATELET
Absolute Monocytes: 286 cells/uL (ref 200–950)
Basophils Absolute: 31 cells/uL (ref 0–200)
Basophils Relative: 0.7 %
Eosinophils Absolute: 101 cells/uL (ref 15–500)
Eosinophils Relative: 2.3 %
HCT: 46.8 % — ABNORMAL HIGH (ref 35.0–45.0)
Hemoglobin: 15 g/dL (ref 11.7–15.5)
Lymphs Abs: 2191 cells/uL (ref 850–3900)
MCH: 28.4 pg (ref 27.0–33.0)
MCHC: 32.1 g/dL (ref 32.0–36.0)
MCV: 88.5 fL (ref 80.0–100.0)
MPV: 11.9 fL (ref 7.5–12.5)
Monocytes Relative: 6.5 %
Neutro Abs: 1791 cells/uL (ref 1500–7800)
Neutrophils Relative %: 40.7 %
Platelets: 237 10*3/uL (ref 140–400)
RBC: 5.29 10*6/uL — ABNORMAL HIGH (ref 3.80–5.10)
RDW: 12.6 % (ref 11.0–15.0)
Total Lymphocyte: 49.8 %
WBC: 4.4 10*3/uL (ref 3.8–10.8)

## 2021-05-21 LAB — COMPLETE METABOLIC PANEL WITH GFR
AG Ratio: 1.6 (calc) (ref 1.0–2.5)
ALT: 11 U/L (ref 6–29)
AST: 15 U/L (ref 10–30)
Albumin: 4.4 g/dL (ref 3.6–5.1)
Alkaline phosphatase (APISO): 85 U/L (ref 31–125)
BUN: 13 mg/dL (ref 7–25)
CO2: 25 mmol/L (ref 20–32)
Calcium: 9.5 mg/dL (ref 8.6–10.2)
Chloride: 107 mmol/L (ref 98–110)
Creat: 0.81 mg/dL (ref 0.50–0.96)
Globulin: 2.8 g/dL (calc) (ref 1.9–3.7)
Glucose, Bld: 93 mg/dL (ref 65–99)
Potassium: 4.3 mmol/L (ref 3.5–5.3)
Sodium: 140 mmol/L (ref 135–146)
Total Bilirubin: 0.7 mg/dL (ref 0.2–1.2)
Total Protein: 7.2 g/dL (ref 6.1–8.1)
eGFR: 107 mL/min/{1.73_m2} (ref >=60)

## 2021-05-21 LAB — LIPASE: Lipase: 23 U/L (ref 7–60)

## 2021-05-21 LAB — IRON,TIBC AND FERRITIN PANEL
%SAT: 28 % (calc) (ref 16–45)
Ferritin: 28 ng/mL (ref 16–154)
Iron: 115 ug/dL (ref 40–190)
TIBC: 407 mcg/dL (calc) (ref 250–450)

## 2021-05-21 LAB — TSH: TSH: 4.11 mIU/L

## 2021-05-22 NOTE — Progress Notes (Signed)
Brenda Mendoza,   Serum iron looks good.  Iron stores low but likely not causing this fatigue.  You could start daily MVI of iron to keep these numbers up. Thyroid is in normal range but trending on the HYPO(low) side. It would be worth checking in another few months if any fatigue persists.  Lipase normal. Kidney, liver, glucose look good.

## 2021-07-23 ENCOUNTER — Ambulatory Visit: Payer: 59 | Admitting: Family Medicine

## 2021-07-23 NOTE — Progress Notes (Deleted)
   Established Patient Office Visit  Subjective   Patient ID: Brenda Mendoza, female    DOB: 22-Aug-2000  Age: 21 y.o. MRN: [id number]  No chief complaint on file.   HPI ADD - Reports symptoms are well controlled on current regime. Denies any problems with insomnia, chest pain, palpitations, or SOB.    F/U Depresion with Anxiety  -    {History (Optional):23778}  ROS    Objective:     There were no vitals taken for this visit. {Vitals History (Optional):23777}  Physical Exam Vitals and nursing note reviewed.  Constitutional:      Appearance: She is well-developed.  HENT:     Head: Normocephalic and atraumatic.  Cardiovascular:     Rate and Rhythm: Normal rate and regular rhythm.     Heart sounds: Normal heart sounds.  Pulmonary:     Effort: Pulmonary effort is normal.     Breath sounds: Normal breath sounds.  Skin:    General: Skin is warm and dry.  Neurological:     Mental Status: She is alert and oriented to person, place, and time.  Psychiatric:        Behavior: Behavior normal.    No results found for any visits on 07/23/21.  {Labs (Optional):23779}  The ASCVD Risk score (Arnett DK, et al., 2019) failed to calculate for the following reasons:   The 2019 ASCVD risk score is only valid for ages 54 to 20    Assessment & Plan:   Problem List Items Addressed This Visit       Other   Depression with anxiety   ADD (attention deficit disorder) - Primary    No follow-ups on file.    Beatrice Lecher, MD

## 2021-08-03 ENCOUNTER — Other Ambulatory Visit (HOSPITAL_BASED_OUTPATIENT_CLINIC_OR_DEPARTMENT_OTHER): Payer: Self-pay

## 2021-08-03 ENCOUNTER — Encounter (HOSPITAL_COMMUNITY): Payer: Self-pay | Admitting: Psychiatry

## 2021-08-03 ENCOUNTER — Telehealth (HOSPITAL_BASED_OUTPATIENT_CLINIC_OR_DEPARTMENT_OTHER): Payer: 59 | Admitting: Psychiatry

## 2021-08-03 ENCOUNTER — Telehealth (HOSPITAL_COMMUNITY): Payer: 59 | Admitting: Psychiatry

## 2021-08-03 VITALS — Wt 110.0 lb

## 2021-08-03 DIAGNOSIS — F418 Other specified anxiety disorders: Secondary | ICD-10-CM | POA: Diagnosis not present

## 2021-08-03 DIAGNOSIS — F900 Attention-deficit hyperactivity disorder, predominantly inattentive type: Secondary | ICD-10-CM | POA: Diagnosis not present

## 2021-08-03 MED ORDER — BUPROPION HCL ER (SR) 100 MG PO TB12
100.0000 mg | ORAL_TABLET | Freq: Every day | ORAL | 1 refills | Status: DC
  Filled 2021-08-03 – 2021-10-18 (×2): qty 30, 30d supply, fill #0

## 2021-08-03 NOTE — Progress Notes (Signed)
Virtual Visit via Video Note ? ?I connected with Brenda Mendoza on 08/03/21 at 10:00 AM EDT by a video enabled telemedicine application and verified that I am speaking with the correct person using two identifiers. ? ?Location: ?Patient: Home ?Provider: Home Office ?  ?I discussed the limitations of evaluation and management by telemedicine and the availability of in person appointments. The patient expressed understanding and agreed to proceed. ? ?History of Present Illness: ?Patient is evaluated by video session.  She is taking Zoloft 100 mg only.  She has not started Adderall.  She has prescriptions never took it.  Her job is going well.  She was employee of the month and happy about her progress.  She is working at the [employer].  Her relationship is also going very well.  She recently bought a new car.  She admitted lately having poor sleep, headaches and there are days she is still struggle with severe fatigue, lack of motivation and tired.  Either she sleeps too much or too little.  Last week was difficult because she has lack of sleep and she believes that causes migraine headaches.  She is in touch with her sister.  Her appetite is okay.  Her weight is stable.  She has no tremor or shakes or any EPS.  She lives with a roommate.  Her Zoloft is prescribed by her PCP.  Recently she had blood work and her labs are normal including thyroid, BUN and creatinine.  She denies drinking or using any illegal substances.  She denies any feeling of hopelessness or worthlessness but there are days when she feels sad anxious and decreased motivation to do things.  ? ?Past Psychiatric History: ?H/O anxiety since school age.  Saw psychiatrist Dr. Melanee Left and diagnosed ADD after psychological testing at [facility] by Franne Forts. Effexor caused withdrawal symptoms, Wellbutrin and Viibryd did not work.  No history of suicidal attempt, inpatient, mania, psychosis, PTSD or OCD symptoms ? ? ?Recent Results (from  the past 2160 hour(s))  ?CBC with Differential/Platelet     Status: Abnormal  ? Collection Time: 05/20/21 12:00 AM  ?Result Value Ref Range  ? WBC 4.4 3.8 - 10.8 Thousand/uL  ? RBC 5.29 (H) 3.80 - 5.10 Million/uL  ? Hemoglobin 15.0 11.7 - 15.5 g/dL  ? HCT 46.8 (H) 35.0 - 45.0 %  ? MCV 88.5 80.0 - 100.0 fL  ? MCH 28.4 27.0 - 33.0 pg  ? MCHC 32.1 32.0 - 36.0 g/dL  ? RDW 12.6 11.0 - 15.0 %  ? Platelets 237 140 - 400 Thousand/uL  ? MPV 11.9 7.5 - 12.5 fL  ? Neutro Abs 1,791 1,500 - 7,800 cells/uL  ? Lymphs Abs 2,191 850 - 3,900 cells/uL  ? Absolute Monocytes 286 200 - 950 cells/uL  ? Eosinophils Absolute 101 15 - 500 cells/uL  ? Basophils Absolute 31 0 - 200 cells/uL  ? Neutrophils Relative % 40.7 %  ? Total Lymphocyte 49.8 %  ? Monocytes Relative 6.5 %  ? Eosinophils Relative 2.3 %  ? Basophils Relative 0.7 %  ?COMPLETE METABOLIC PANEL WITH GFR     Status: None  ? Collection Time: 05/20/21 12:00 AM  ?Result Value Ref Range  ? Glucose, Bld 93 65 - 99 mg/dL  ?  Comment: . ?           Fasting reference interval ?. ?  ? BUN 13 7 - 25 mg/dL  ? Creat 0.81 0.50 - 0.96 mg/dL  ? eGFR 107 >  OR = 60 mL/min/1.72m  ?  Comment: The eGFR is based on the CKD-EPI 2021 equation. To calculate  ?the new eGFR from a previous Creatinine or Cystatin C ?result, go to https://www.kidney.org/professionals/ ?kdoqi/gfr%5Fcalculator ?  ? BUN/Creatinine Ratio NOT APPLICABLE 6 - 22 (calc)  ? Sodium 140 135 - 146 mmol/L  ? Potassium 4.3 3.5 - 5.3 mmol/L  ? Chloride 107 98 - 110 mmol/L  ? CO2 25 20 - 32 mmol/L  ? Calcium 9.5 8.6 - 10.2 mg/dL  ? Total Protein 7.2 6.1 - 8.1 g/dL  ? Albumin 4.4 3.6 - 5.1 g/dL  ? Globulin 2.8 1.9 - 3.7 g/dL (calc)  ? AG Ratio 1.6 1.0 - 2.5 (calc)  ? Total Bilirubin 0.7 0.2 - 1.2 mg/dL  ? Alkaline phosphatase (APISO) 85 31 - 125 U/L  ? AST 15 10 - 30 U/L  ? ALT 11 6 - 29 U/L  ?Lipase     Status: None  ? Collection Time: 05/20/21 12:00 AM  ?Result Value Ref Range  ? Lipase 23 7 - 60 U/L  ?TSH     Status: None  ? Collection  Time: 05/20/21 12:00 AM  ?Result Value Ref Range  ? TSH 4.11 mIU/L  ?  Comment:           Reference Range ?. ?          > or = 20 Years  0.40-4.50 ?. ?               Pregnancy Ranges ?          First trimester    0.26-2.66 ?          Second trimester   0.55-2.73 ?          Third trimester    0.43-2.91 ?  ?Fe+TIBC+Fer     Status: None  ? Collection Time: 05/20/21 12:00 AM  ?Result Value Ref Range  ? Iron 115 40 - 190 mcg/dL  ? TIBC 407 250 - 450 mcg/dL (calc)  ? %SAT 28 16 - 45 % (calc)  ? Ferritin 28 16 - 154 ng/mL  ?  ? ?Psychiatric Specialty Exam: ?Physical Exam  ?Review of Systems  ?Weight 110 lb (49.9 kg).There is no height or weight on file to calculate BMI.  ?General Appearance: Casual  ?Eye Contact:  Good  ?Speech:  Normal Rate  ?Volume:  Normal  ?Mood:  Depressed and Dysphoric  ?Affect:  Congruent  ?Thought Process:  Goal Directed  ?Orientation:  Full (Time, Place, and Person)  ?Thought Content:  Logical  ?Suicidal Thoughts:  No  ?Homicidal Thoughts:  No  ?Memory:  Immediate;   Good ?Recent;   Good ?Remote;   Good  ?Judgement:  Good  ?Insight:  Present  ?Psychomotor Activity:  Normal  ?Concentration:  Concentration: Good and Attention Span: Good  ?Recall:  Good  ?Fund of Knowledge:  Good  ?Language:  Good  ?Akathisia:  No  ?Handed:  Right  ?AIMS (if indicated):     ?Assets:  Communication Skills ?Desire for Improvement ?Housing ?Resilience ?Social Support ?Talents/Skills ?Transportation  ?ADL's:  Intact  ?Cognition:  WNL  ?Sleep:   fair  ? ? ? ? ?Assessment and Plan: ?Depression with anxiety.  ADD, inattentive type. ? ?Overall patient doing well but is still have symptoms of depression, anxiety fatigue and lack of motivation to do things.  She had tried Zoloft 125 mg but that did not help.  In the past she tried Wellbutrin that worked  for a while and then stopped.  She was prescribed Adderall which she has not tried.  She had tried Vyvanse but did not like the side effects.  We talk about trying a low-dose  Wellbutrin work Zoloft to help her residual symptoms of anxiety, depression and fatigue.  I reviewed blood work results which is normal.  Her thyroid and BUN/creatinine is normal.  Patient agreed to give a trial a low-dose of Wellbutrin with Zoloft.  Her Zoloft is given by her PCP.  We will add 100 mg Wellbutrin and she will try to see if that helps her symptoms.  I recommended if she has any question, concern if she feels worsening of the symptoms then she should call us immediately.  I discussed medication side effects and benefits.  Recommended to take the Wellbutrin first thing when she wakes up.  She will work until 11 PM.  Follow-up in 2 months. ? ?Follow Up Instructions: ? ?  ?I discussed the assessment and treatment plan with the patient. The patient was provided an opportunity to ask questions and all were answered. The patient agreed with the plan and demonstrated an understanding of the instructions. ?  ?The patient was advised to call back or seek an in-person evaluation if the symptoms worsen or if the condition fails to improve as anticipated. ? ?Collaboration of Care: Primary Care Provider AEB notes are available in epic to review. ? ?Patient/Guardian was advised Release of Information must be obtained prior to any record release in order to collaborate their care with an outside provider. Patient/Guardian was advised if they have not already done so to contact the registration department to sign all necessary forms in order for [place] to release information regarding their care.  ? ?Consent: Patient/Guardian gives verbal consent for treatment and assignment of benefits for services provided during this visit. Patient/Guardian expressed understanding and agreed to proceed.   ? ?I provided 27 minutes of non-face-to-face time during this encounter. ? ? ?Kathlee Nations, MD  ?

## 2021-08-14 ENCOUNTER — Other Ambulatory Visit (HOSPITAL_BASED_OUTPATIENT_CLINIC_OR_DEPARTMENT_OTHER): Payer: Self-pay

## 2021-09-04 DIAGNOSIS — R112 Nausea with vomiting, unspecified: Secondary | ICD-10-CM | POA: Diagnosis not present

## 2021-09-04 DIAGNOSIS — K253 Acute gastric ulcer without hemorrhage or perforation: Secondary | ICD-10-CM | POA: Diagnosis not present

## 2021-10-05 ENCOUNTER — Telehealth (HOSPITAL_COMMUNITY): Payer: 59 | Admitting: Psychiatry

## 2021-10-18 ENCOUNTER — Other Ambulatory Visit: Payer: Self-pay | Admitting: Family Medicine

## 2021-10-18 DIAGNOSIS — G43109 Migraine with aura, not intractable, without status migrainosus: Secondary | ICD-10-CM

## 2021-10-19 ENCOUNTER — Other Ambulatory Visit (HOSPITAL_BASED_OUTPATIENT_CLINIC_OR_DEPARTMENT_OTHER): Payer: Self-pay

## 2021-10-19 MED ORDER — TOPIRAMATE 25 MG PO TABS
25.0000 mg | ORAL_TABLET | Freq: Every day | ORAL | 1 refills | Status: DC
  Filled 2021-10-19: qty 90, 90d supply, fill #0
  Filled 2022-03-26: qty 90, 90d supply, fill #1

## 2021-10-20 ENCOUNTER — Other Ambulatory Visit (HOSPITAL_BASED_OUTPATIENT_CLINIC_OR_DEPARTMENT_OTHER): Payer: Self-pay

## 2021-11-02 ENCOUNTER — Other Ambulatory Visit: Payer: Self-pay

## 2021-11-02 ENCOUNTER — Emergency Department (HOSPITAL_COMMUNITY): Admission: EM | Admit: 2021-11-02 | Discharge: 2021-11-02 | Discharge status: Against Medical Advice | Payer: 59

## 2021-11-02 ENCOUNTER — Encounter (HOSPITAL_BASED_OUTPATIENT_CLINIC_OR_DEPARTMENT_OTHER): Payer: Self-pay | Admitting: Emergency Medicine

## 2021-11-02 ENCOUNTER — Emergency Department (HOSPITAL_BASED_OUTPATIENT_CLINIC_OR_DEPARTMENT_OTHER): Payer: 59

## 2021-11-02 DIAGNOSIS — S61311A Laceration without foreign body of left index finger with damage to nail, initial encounter: Secondary | ICD-10-CM | POA: Diagnosis not present

## 2021-11-02 DIAGNOSIS — Z23 Encounter for immunization: Secondary | ICD-10-CM | POA: Insufficient documentation

## 2021-11-02 DIAGNOSIS — W268XXA Contact with other sharp object(s), not elsewhere classified, initial encounter: Secondary | ICD-10-CM | POA: Insufficient documentation

## 2021-11-02 DIAGNOSIS — Y990 Civilian activity done for income or pay: Secondary | ICD-10-CM | POA: Insufficient documentation

## 2021-11-02 DIAGNOSIS — S6992XA Unspecified injury of left wrist, hand and finger(s), initial encounter: Secondary | ICD-10-CM | POA: Diagnosis present

## 2021-11-02 NOTE — ED Triage Notes (Addendum)
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   Bleeding controlled at time of triage. Saline soaked gauze applied to finger, wrapped with coban.

## 2021-11-02 NOTE — ED Notes (Signed)
Patient seen leaving ED before triage.

## 2021-11-03 ENCOUNTER — Emergency Department (HOSPITAL_BASED_OUTPATIENT_CLINIC_OR_DEPARTMENT_OTHER)
Admission: EM | Admit: 2021-11-03 | Discharge: 2021-11-03 | Disposition: A | Discharge status: Home / Self Care | Payer: No Typology Code available for payment source | Attending: Emergency Medicine | Admitting: Emergency Medicine

## 2021-11-03 DIAGNOSIS — S61311A Laceration without foreign body of left index finger with damage to nail, initial encounter: Secondary | ICD-10-CM

## 2021-11-03 MED ORDER — TETANUS-DIPHTH-ACELL PERTUSSIS 5-2.5-18.5 LF-MCG/0.5 IM SUSY
0.5000 mL | PREFILLED_SYRINGE | Freq: Once | INTRAMUSCULAR | Status: AC
  Administered 2021-11-03: 0.5 mL via INTRAMUSCULAR
  Filled 2021-11-03: qty 0.5

## 2021-11-03 MED ORDER — LIDOCAINE HCL 2 % IJ SOLN
10.0000 mL | Freq: Once | INTRAMUSCULAR | Status: AC
  Administered 2021-11-03: 200 mg
  Filled 2021-11-03: qty 20

## 2021-11-03 NOTE — ED Notes (Signed)
Suture cart at patient bedside. 

## 2021-11-03 NOTE — ED Provider Notes (Signed)
[facility] EMERGENCY DEPARTMENT  Provider Note  CSN: [id number] Arrival date & time: 11/02/21 2149  History Chief Complaint  Patient presents with   Extremity Laceration    Brenda Mendoza is a 21 y.o. female here after sustaining a laceration to her L index finger while cutting bread at work. Unsure of last TDAP.    Home Medications Prior to Admission medications   Medication Sig Start Date End Date Taking? Authorizing Provider  amphetamine-dextroamphetamine (ADDERALL) 5 MG tablet Take 1 tablet (5 mg total) by mouth daily. Patient not taking: Reported on 08/03/2021 01/19/21   Agapito Games, MD  buPROPion ER [facility] SR) 100 MG 12 hr tablet Take 1 tablet (100 mg total) by mouth daily. 08/03/21   Arfeen, Phillips Grout, MD  drospirenone-ethinyl estradiol (YASMIN) 3-0.03 MG tablet Take 1 tablet by mouth daily. 01/19/21   Agapito Games, MD  omeprazole (PRILOSEC) 40 MG capsule Take 1 capsule (40 mg total) by mouth daily. 05/20/21   Breeback, Jade L, PA-C  rizatriptan (MAXALT-MLT) 5 MG disintegrating tablet Take 1 tablet (5 mg total) by mouth as needed for migraine. May repeat in 2 hours if needed. This is 90 day supply 09/04/20   Agapito Games, MD  sertraline (ZOLOFT) 100 MG tablet Take 1 tablet (100 mg total) by mouth daily. 01/19/21   Agapito Games, MD  sertraline (ZOLOFT) 25 MG tablet TAKE 1 TABLET BY MOUTH DAILY **TAKE AT THE SAME TIME AS SERTRALINE 100MG ** Patient not taking: Reported on 08/03/2021 01/19/21 01/19/22  01/21/22, MD  topiramate (TOPAMAX) 25 MG tablet Take 1 tablet (25 mg total) by mouth daily. 10/19/21   10/21/21, MD     Allergies    Intuniv [guanfacine], Vyvanse [lisdexamfetamine], and Prochlorperazine   Review of Systems   Review of Systems Please see HPI for pertinent positives and negatives  Physical Exam BP 117/82 (BP Location: Right Arm)   Pulse 84   Temp 98.6 F (37 C) (Oral)   Resp 17   LMP  10/26/2021 (Approximate)   SpO2 100%   Physical Exam Vitals and nursing note reviewed.  HENT:     Head: Normocephalic.     Nose: Nose normal.  Eyes:     Extraocular Movements: Extraocular movements intact.  Pulmonary:     Effort: Pulmonary effort is normal.  Musculoskeletal:        General: Normal range of motion.     Cervical back: Neck supple.     Comments: 1.5cm curvilinear laceration to distal radial L index finger including nail margin  Skin:    Findings: No rash (on exposed skin).  Neurological:     Mental Status: She is alert and oriented to person, place, and time.  Psychiatric:        Mood and Affect: Mood normal.     ED Results / Procedures / Treatments   EKG None  Procedures .07/26/2023Laceration Repair  Date/Time: 11/03/2021 2:19 AM  Performed by: 01/03/2022, MD Authorized by: Pollyann Savoy, MD   Consent:    Consent obtained:  Verbal   Consent given by:  Patient Anesthesia:    Anesthesia method:  Nerve block   Block anesthetic:  Lidocaine 2% w/o epi   Block technique:  Digital Laceration details:    Location:  Finger   Finger location:  L index finger   Length (cm):  1.5 Pre-procedure details:    Preparation:  Patient was prepped and draped in usual sterile  fashion Treatment:    Area cleansed with:  Soap and water   Amount of cleaning:  Extensive   Irrigation solution:  Sterile water Skin repair:    Repair method:  Sutures   Suture size:  6-0   Suture material:  Nylon   Number of sutures:  2 Approximation:    Approximation:  Close Repair type:    Repair type:  Simple Post-procedure details:    Dressing:  Non-adherent dressing   Procedure completion:  Tolerated well, no immediate complications   Medications Ordered in the ED Medications  lidocaine (XYLOCAINE) 2 % (with pres) injection 200 mg (has no administration in time range)  Tdap (BOOSTRIX) injection 0.5 mL (has no administration in time range)    Initial Impression and Plan   Wound repaired as above. Patient decline nail removal. Flap was tacked down. TDAP updated. Suture removal in 1 week.   ED Course   Clinical Course as of 11/03/21 0221  Tue Nov 03, 2021  0201 I personally viewed the images from radiology studies and agree with radiologist interpretation:  Xray is neg.  [CS]    Clinical Course User Index [CS] Pollyann Savoy, MD     MDM Rules/Calculators/A&P Medical Decision Making Problems Addressed: Laceration of left index finger without foreign body with damage to nail, initial encounter: acute illness or injury  Amount and/or Complexity of Data Reviewed Radiology: ordered and independent interpretation performed. Decision-making details documented in ED Course.  Risk Prescription drug management.    Final Clinical Impression(s) / ED Diagnoses Final diagnoses:  Laceration of left index finger without foreign body with damage to nail, initial encounter    Rx / DC Orders ED Discharge Orders     None        Pollyann Savoy, MD 11/03/21 0221

## 2021-11-03 NOTE — ED Notes (Signed)
ED Provider at bedside. 

## 2021-11-05 ENCOUNTER — Encounter (INDEPENDENT_AMBULATORY_CARE_PROVIDER_SITE_OTHER): Payer: 59 | Admitting: Physician Assistant

## 2021-11-05 DIAGNOSIS — F418 Other specified anxiety disorders: Secondary | ICD-10-CM | POA: Diagnosis not present

## 2021-11-06 ENCOUNTER — Other Ambulatory Visit (HOSPITAL_BASED_OUTPATIENT_CLINIC_OR_DEPARTMENT_OTHER): Payer: Self-pay

## 2021-11-06 MED ORDER — HYDROXYZINE HCL 25 MG PO TABS
25.0000 mg | ORAL_TABLET | Freq: Three times a day (TID) | ORAL | 0 refills | Status: DC | PRN
  Filled 2021-11-06: qty 30, 5d supply, fill #0

## 2021-11-06 NOTE — Telephone Encounter (Signed)
I spent 5 total minutes of online digital evaluation and management services in this patient-initiated request for online care. 

## 2021-11-10 ENCOUNTER — Ambulatory Visit: Payer: 59 | Admitting: Sports Medicine

## 2021-11-10 ENCOUNTER — Encounter: Payer: Self-pay | Admitting: Sports Medicine

## 2021-11-10 DIAGNOSIS — S61218A Laceration without foreign body of other finger without damage to nail, initial encounter: Secondary | ICD-10-CM | POA: Insufficient documentation

## 2021-11-10 DIAGNOSIS — S61311D Laceration without foreign body of left index finger with damage to nail, subsequent encounter: Secondary | ICD-10-CM | POA: Diagnosis not present

## 2021-11-10 NOTE — Progress Notes (Signed)
    Procedures performed today:    None.  Independent interpretation of notes and tests performed by another provider:   None.  Brief History, Exam, Impression, and Recommendations:    Laceration of index finger Pleasant 21 year old female, accidental laceration at work at the [employer], left index finger, she lacerated through the nail and lateral distal phalanx. She was seen in the ED last week and had a couple of stitches placed. Today the wound looks clean, dry, intact but the stitches are not yet ready to come out. I think she should come back sometime early next week and then we can consider removal of the stitches +/- application of Dermabond. She did get her Tdap in the ED.    ____________________________________________ Ihor Austin. Benjamin Stain, M.D., ABFM., CAQSM., AME. Primary Care and Sports Medicine [facility]  Adjunct Professor of Family Medicine  [place] [employer]  Restaurant manager, fast food

## 2021-11-10 NOTE — Assessment & Plan Note (Signed)
Pleasant 21 year old female, accidental laceration at work at the [employer], left index finger, she lacerated through the nail and lateral distal phalanx. She was seen in the ED last week and had a couple of stitches placed. Today the wound looks clean, dry, intact but the stitches are not yet ready to come out. I think she should come back sometime early next week and then we can consider removal of the stitches +/- application of Dermabond. She did get her Tdap in the ED.

## 2021-11-16 ENCOUNTER — Ambulatory Visit (INDEPENDENT_AMBULATORY_CARE_PROVIDER_SITE_OTHER): Payer: 59 | Admitting: Sports Medicine

## 2021-11-16 DIAGNOSIS — S61311D Laceration without foreign body of left index finger with damage to nail, subsequent encounter: Secondary | ICD-10-CM

## 2021-11-16 NOTE — Progress Notes (Signed)
    Procedures performed today:    None.  Independent interpretation of notes and tests performed by another provider:   None.  Brief History, Exam, Impression, and Recommendations:    Laceration of index finger This pleasant 21 year old female returns, she had a laceration at work, at the last visit on the eighth I did not think the sutures were ready to come out, today the wound is well-healed, sutures removed, Dermabond applied, return as needed. See previous notes, up-to-date on Tdap.    ____________________________________________ Ihor Austin. Benjamin Stain, M.D., ABFM., CAQSM., AME. Primary Care and Sports Medicine [facility]  Adjunct Professor of Family Medicine  [place] [employer]  Restaurant manager, fast food

## 2021-11-16 NOTE — Assessment & Plan Note (Signed)
This pleasant 21 year old female returns, she had a laceration at work, at the last visit on the eighth I did not think the sutures were ready to come out, today the wound is well-healed, sutures removed, Dermabond applied, return as needed. See previous notes, up-to-date on Tdap.

## 2021-11-26 ENCOUNTER — Other Ambulatory Visit (HOSPITAL_BASED_OUTPATIENT_CLINIC_OR_DEPARTMENT_OTHER): Payer: Self-pay

## 2021-11-26 ENCOUNTER — Other Ambulatory Visit: Payer: Self-pay | Admitting: Family Medicine

## 2021-11-27 ENCOUNTER — Other Ambulatory Visit (HOSPITAL_BASED_OUTPATIENT_CLINIC_OR_DEPARTMENT_OTHER): Payer: Self-pay

## 2021-11-27 MED ORDER — RIZATRIPTAN BENZOATE 5 MG PO TBDP
5.0000 mg | ORAL_TABLET | ORAL | 3 refills | Status: DC | PRN
  Filled 2021-11-27: qty 30, 90d supply, fill #0
  Filled 2022-03-26: qty 30, 90d supply, fill #1

## 2022-03-26 ENCOUNTER — Other Ambulatory Visit: Payer: Self-pay

## 2022-03-26 ENCOUNTER — Other Ambulatory Visit (HOSPITAL_BASED_OUTPATIENT_CLINIC_OR_DEPARTMENT_OTHER): Payer: Self-pay

## 2022-03-26 ENCOUNTER — Other Ambulatory Visit: Payer: Self-pay | Admitting: Family Medicine

## 2022-03-26 DIAGNOSIS — F418 Other specified anxiety disorders: Secondary | ICD-10-CM

## 2022-03-26 MED ORDER — DROSPIRENONE-ETHINYL ESTRADIOL 3-0.03 MG PO TABS
1.0000 | ORAL_TABLET | Freq: Every day | ORAL | 0 refills | Status: DC
  Filled 2022-03-26: qty 28, 28d supply, fill #0

## 2022-03-26 MED ORDER — SERTRALINE HCL 100 MG PO TABS
100.0000 mg | ORAL_TABLET | Freq: Every day | ORAL | 0 refills | Status: DC
  Filled 2022-03-26: qty 30, 30d supply, fill #0

## 2022-04-21 ENCOUNTER — Telehealth: Payer: Self-pay | Admitting: Family Medicine

## 2022-04-21 NOTE — Telephone Encounter (Signed)
Please call pt snd remind her due for a physical and pap unless she is seeing gyn now.  Also see if had her flu shot this year or not

## 2022-04-22 NOTE — Telephone Encounter (Signed)
LVM asking pt to call and update [place] on whether or not she has had her pap and flu shot done or if she would like to schedule this with [place]. Asked that she either rtn call or send mychart

## 2022-04-26 ENCOUNTER — Encounter: Payer: Self-pay | Admitting: Family Medicine

## 2022-06-02 ENCOUNTER — Other Ambulatory Visit (HOSPITAL_BASED_OUTPATIENT_CLINIC_OR_DEPARTMENT_OTHER): Payer: Self-pay

## 2022-06-02 ENCOUNTER — Other Ambulatory Visit: Payer: Self-pay | Admitting: Family Medicine

## 2022-06-02 DIAGNOSIS — F418 Other specified anxiety disorders: Secondary | ICD-10-CM

## 2022-06-02 MED ORDER — SERTRALINE HCL 100 MG PO TABS
100.0000 mg | ORAL_TABLET | Freq: Every day | ORAL | 0 refills | Status: DC
  Filled 2022-06-02: qty 30, 30d supply, fill #0

## 2022-06-02 NOTE — Telephone Encounter (Signed)
Please call pt she is overdue for office visit

## 2022-06-03 NOTE — Telephone Encounter (Signed)
Pt scheduled for 07/01/2022 @ 4:00 tvt

## 2022-07-01 ENCOUNTER — Encounter: Payer: Self-pay | Admitting: Family Medicine

## 2022-07-01 ENCOUNTER — Other Ambulatory Visit (HOSPITAL_BASED_OUTPATIENT_CLINIC_OR_DEPARTMENT_OTHER): Payer: Self-pay

## 2022-07-01 ENCOUNTER — Ambulatory Visit: Payer: Commercial Managed Care - PPO | Admitting: Family Medicine

## 2022-07-01 VITALS — BP 100/60 | HR 72 | Ht 61.0 in | Wt 108.0 lb

## 2022-07-01 DIAGNOSIS — F418 Other specified anxiety disorders: Secondary | ICD-10-CM | POA: Diagnosis not present

## 2022-07-01 DIAGNOSIS — G43109 Migraine with aura, not intractable, without status migrainosus: Secondary | ICD-10-CM | POA: Diagnosis not present

## 2022-07-01 DIAGNOSIS — R6889 Other general symptoms and signs: Secondary | ICD-10-CM | POA: Diagnosis not present

## 2022-07-01 DIAGNOSIS — Z124 Encounter for screening for malignant neoplasm of cervix: Secondary | ICD-10-CM | POA: Diagnosis not present

## 2022-07-01 DIAGNOSIS — F900 Attention-deficit hyperactivity disorder, predominantly inattentive type: Secondary | ICD-10-CM

## 2022-07-01 DIAGNOSIS — G43009 Migraine without aura, not intractable, without status migrainosus: Secondary | ICD-10-CM

## 2022-07-01 DIAGNOSIS — Z3009 Encounter for other general counseling and advice on contraception: Secondary | ICD-10-CM

## 2022-07-01 MED ORDER — TOPIRAMATE 25 MG PO TABS
25.0000 mg | ORAL_TABLET | Freq: Every day | ORAL | 1 refills | Status: DC
  Filled 2022-07-01: qty 90, 90d supply, fill #0
  Filled 2022-12-20: qty 90, 90d supply, fill #1

## 2022-07-01 MED ORDER — SERTRALINE HCL 100 MG PO TABS
100.0000 mg | ORAL_TABLET | Freq: Every day | ORAL | 1 refills | Status: DC
  Filled 2022-07-01: qty 90, 90d supply, fill #0
  Filled 2022-12-20: qty 90, 90d supply, fill #1

## 2022-07-01 MED ORDER — NORELGESTROMIN-ETH ESTRADIOL 150-35 MCG/24HR TD PTWK
1.0000 | MEDICATED_PATCH | TRANSDERMAL | 12 refills | Status: DC
  Filled 2022-07-01: qty 3, 21d supply, fill #0
  Filled 2022-08-18: qty 3, 21d supply, fill #1
  Filled 2022-09-05: qty 3, 21d supply, fill #2
  Filled 2022-10-04: qty 3, 21d supply, fill #3
  Filled 2022-10-18 – 2022-10-20 (×2): qty 3, 21d supply, fill #4

## 2022-07-01 NOTE — Assessment & Plan Note (Signed)
Will refill Topamax.  Doing well with her current regimen.

## 2022-07-01 NOTE — Assessment & Plan Note (Signed)
Not currently on medication 

## 2022-07-01 NOTE — Assessment & Plan Note (Signed)
Continue with the 100 mg sertraline and encouraged her to add the 25 mg dose she still has it at home.  With recent bump up in her symptoms I do think that that would be a reasonable option to try to increase her dose for a few months.  She could also consider adding Wellbutrin she never actually started that either but for now recommend increasing the sertraline.

## 2022-07-01 NOTE — Progress Notes (Signed)
Established Patient Office Visit  Subjective   Patient ID: Brenda Mendoza, female    DOB: 31-Dec-2000  Age: 22 y.o. MRN: [id number]  Chief Complaint  Patient presents with   medicaton refills    HPI  ADD - she never started the Adderal.  She felt a little nervous about her last saw her and just decided not to start it.  Follow-up depression with anxiety-  She never started the Wellbutrin.  She is currently on sertraline 100 mg.  She is not taking the extra 25 mg.  She says she was doing really well with her mood up until a couple of weeks ago when she started feeling more anxious and stressed.  She is gena complete her schooling this summer in August.  She has been feeling cold and dizzy when she stands up .   She also wanted to discuss her birth control.  She has been thinking about something a little bit more long-term like maybe an IUD.  Wanted to discuss her options.      ROS    Objective:     BP 100/60   Pulse 72   Ht 5\' 1"  (1.549 m)   Wt 108 lb (49 kg)   LMP 06/22/2022 (Approximate)   SpO2 99%   BMI 20.41 kg/m    Physical Exam Vitals and nursing note reviewed.  Constitutional:      Appearance: She is well-developed.  HENT:     Head: Normocephalic and atraumatic.  Cardiovascular:     Rate and Rhythm: Normal rate and regular rhythm.     Heart sounds: Normal heart sounds.  Pulmonary:     Effort: Pulmonary effort is normal.     Breath sounds: Normal breath sounds.  Skin:    General: Skin is warm and dry.  Neurological:     Mental Status: She is alert and oriented to person, place, and time.  Psychiatric:        Behavior: Behavior normal.      No results found for any visits on 07/01/22.    The ASCVD Risk score (Arnett DK, et al., 2019) failed to calculate for the following reasons:   The 2019 ASCVD risk score is only valid for ages 61 to 56    Assessment & Plan:   Problem List Items Addressed This Visit       Cardiovascular and Mediastinum    Migraine without aura    Will refill Topamax.  Doing well with her current regimen.      Relevant Medications   sertraline (ZOLOFT) 100 MG tablet   topiramate (TOPAMAX) 25 MG tablet     Other   Depression with anxiety    Continue with the 100 mg sertraline and encouraged her to add the 25 mg dose she still has it at home.  With recent bump up in her symptoms I do think that that would be a reasonable option to try to increase her dose for a few months.  She could also consider adding Wellbutrin she never actually started that either but for now recommend increasing the sertraline.      Relevant Medications   sertraline (ZOLOFT) 100 MG tablet   ADD (attention deficit disorder)    Not currently on medication.       Other Visit Diagnoses     Cold sensitivity    -  Primary   Relevant Orders   Fe+TIBC+Fer   CBC   Migraine with aura and without status migrainosus, not  intractable  (Chronic)      Relevant Medications   sertraline (ZOLOFT) 100 MG tablet   topiramate (TOPAMAX) 25 MG tablet   Pap smear for cervical cancer screening       Relevant Orders   Ambulatory referral to Obstetrics / Gynecology   Encounter for counseling regarding contraception       Relevant Medications   norelgestromin-ethinyl estradiol Marilu Favre) 150-35 MCG/24HR transdermal patch       Will check for iron deficiency.  She has had low iron before.  Will get updated lab work today.  Contraception-we did discuss options she would like to try the Ortho Evra patch.  She is still considering an IUD but is worried about it being painful.  She is also due for her first Pap smear so we discussed getting her set up she would prefer to see GYN for that we will make referral down the hall.  That way if she decides to do an IUD she can have that done with them as well.  Return in about 6 months (around 01/01/2023) for Mood.    Beatrice Lecher, MD

## 2022-07-02 ENCOUNTER — Other Ambulatory Visit (HOSPITAL_BASED_OUTPATIENT_CLINIC_OR_DEPARTMENT_OTHER): Payer: Self-pay

## 2022-07-02 LAB — CBC
Hematocrit: 44.7 % (ref 34.0–46.6)
Hemoglobin: 14.7 g/dL (ref 11.1–15.9)
MCH: 28.5 pg (ref 26.6–33.0)
MCHC: 32.9 g/dL (ref 31.5–35.7)
MCV: 87 fL (ref 79–97)
Platelets: 291 10*3/uL (ref 150–450)
RBC: 5.15 x10E6/uL (ref 3.77–5.28)
RDW: 13.1 % (ref 11.7–15.4)
WBC: 6.9 10*3/uL (ref 3.4–10.8)

## 2022-07-02 LAB — IRON,TIBC AND FERRITIN PANEL
Ferritin: 26 ng/mL (ref 15–150)
Iron Saturation: 40 % (ref 15–55)
Iron: 144 ug/dL (ref 27–159)
Total Iron Binding Capacity: 362 ug/dL (ref 250–450)
UIBC: 218 ug/dL (ref 131–425)

## 2022-07-05 NOTE — Progress Notes (Signed)
HI Brenda Mendoza,  Your ferritin which is a reflection of your iron stores is definitely on the low end. It is actually fairly similar to what it was a year ago but we really would like to give it above 40 if possible.  So would recommend taking either a multivitamin with iron, extra iron, or eating an iron rich diet.  Hemoglobin is normal which is great.

## 2022-07-22 ENCOUNTER — Encounter: Payer: Self-pay | Admitting: Family Medicine

## 2022-07-22 DIAGNOSIS — F418 Other specified anxiety disorders: Secondary | ICD-10-CM

## 2022-07-23 NOTE — Addendum Note (Signed)
Addended by: Nani Gasser D on: 07/23/2022 07:57 AM   Modules accepted: Orders

## 2022-07-23 NOTE — Addendum Note (Signed)
Addended by: Chalmers Cater on: 07/23/2022 07:15 AM   Modules accepted: Orders

## 2022-09-03 ENCOUNTER — Ambulatory Visit: Payer: Commercial Managed Care - PPO | Admitting: Medical-Surgical

## 2022-09-03 ENCOUNTER — Encounter: Payer: Self-pay | Admitting: Medical-Surgical

## 2022-09-03 VITALS — BP 99/65 | HR 82 | Resp 20 | Ht 61.0 in | Wt 109.0 lb

## 2022-09-03 DIAGNOSIS — N760 Acute vaginitis: Secondary | ICD-10-CM

## 2022-09-03 LAB — POCT URINALYSIS DIP (CLINITEK)
Bilirubin, UA: NEGATIVE
Blood, UA: NEGATIVE
Glucose, UA: NEGATIVE mg/dL
Ketones, POC UA: NEGATIVE mg/dL
Leukocytes, UA: NEGATIVE
Nitrite, UA: NEGATIVE
POC PROTEIN,UA: NEGATIVE
Spec Grav, UA: 1.02 (ref 1.010–1.025)
Urobilinogen, UA: 0.2 E.U./dL
pH, UA: 8.5 — AB (ref 5.0–8.0)

## 2022-09-03 NOTE — Progress Notes (Signed)
        Established patient visit  History, exam, impression, and plan:  1. Acute vaginitis Pleasant 22 year old female presenting today for evaluation of 1-2 months of concerning vaginal symptoms.  Notes that she has some intermittent pain and itching at the vaginal introitus but has had no other concerning symptoms.  She is sexually active with both female and female partners however she has not had intercourse since the symptoms began.  Denies abdominal pain, vaginal discharge, irregular bleeding, or vaginal lesions.  Does note mild redness to the area affected when pain and itching are present.  POCT UA normal outside of mildly elevated pH.  Sending for culture.  Running urine for GC chlamydia.  Stat wet prep today.  Okay to use over-the-counter topicals such as vaginocele to help with symptoms.  If testing negative and symptoms do not improve, plan to return to the office for a pelvic exam and collection of a sure swab.  Reviewed bladder hygiene as well as appropriate pelvic care to prevent vaginitis. - WET PREP FOR TRICH, YEAST, CLUE - C. trachomatis/N. gonorrhoeae RNA - POCT URINALYSIS DIP (CLINITEK) - Urine Culture  Procedures performed this visit: None.  Return if symptoms worsen or fail to improve.  __________________________________ Thayer Ohm, DNP, APRN, FNP-BC Primary Care and Sports Medicine [facility] [place] [facility]

## 2022-09-04 LAB — WET PREP FOR TRICH, YEAST, CLUE
MICRO NUMBER:: 15025870
Specimen Quality: ADEQUATE

## 2022-09-04 LAB — URINE CULTURE
MICRO NUMBER:: 15026073
Result:: NO GROWTH
SPECIMEN QUALITY:: ADEQUATE

## 2022-09-04 MED ORDER — FLUCONAZOLE 150 MG PO TABS
150.0000 mg | ORAL_TABLET | Freq: Once | ORAL | 0 refills | Status: AC
  Filled 2022-09-04: qty 1, 1d supply, fill #0

## 2022-09-04 MED ORDER — METRONIDAZOLE 500 MG PO TABS
500.0000 mg | ORAL_TABLET | Freq: Two times a day (BID) | ORAL | 0 refills | Status: AC
Start: 2022-09-04 — End: 2022-09-13
  Filled 2022-09-04: qty 14, 7d supply, fill #0

## 2022-09-04 NOTE — Addendum Note (Signed)
Addended byChristen Butter on: 09/04/2022 07:12 PM   Modules accepted: Orders

## 2022-09-05 LAB — C. TRACHOMATIS/N. GONORRHOEAE RNA
C. trachomatis RNA, TMA: NOT DETECTED
N. gonorrhoeae RNA, TMA: NOT DETECTED

## 2022-09-06 ENCOUNTER — Other Ambulatory Visit (HOSPITAL_BASED_OUTPATIENT_CLINIC_OR_DEPARTMENT_OTHER): Payer: Self-pay

## 2022-10-05 ENCOUNTER — Ambulatory Visit (INDEPENDENT_AMBULATORY_CARE_PROVIDER_SITE_OTHER): Payer: Commercial Managed Care - PPO | Admitting: Professional

## 2022-10-05 ENCOUNTER — Encounter: Payer: Self-pay | Admitting: Professional

## 2022-10-05 DIAGNOSIS — F900 Attention-deficit hyperactivity disorder, predominantly inattentive type: Secondary | ICD-10-CM

## 2022-10-05 DIAGNOSIS — F418 Other specified anxiety disorders: Secondary | ICD-10-CM | POA: Diagnosis not present

## 2022-10-05 NOTE — Progress Notes (Signed)
° ° ° ° ° ° ° ° ° ° ° ° ° ° °  Brenda Mendoza, LCMHC °
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
## 2022-10-08 ENCOUNTER — Ambulatory Visit: Payer: Commercial Managed Care - PPO | Admitting: Professional

## 2022-10-18 ENCOUNTER — Other Ambulatory Visit (HOSPITAL_BASED_OUTPATIENT_CLINIC_OR_DEPARTMENT_OTHER): Payer: Self-pay

## 2022-10-19 ENCOUNTER — Other Ambulatory Visit (HOSPITAL_BASED_OUTPATIENT_CLINIC_OR_DEPARTMENT_OTHER): Payer: Self-pay

## 2022-11-01 ENCOUNTER — Other Ambulatory Visit: Payer: Self-pay | Admitting: Sports Medicine

## 2022-11-02 ENCOUNTER — Ambulatory Visit: Payer: Commercial Managed Care - PPO | Admitting: Family Medicine

## 2022-11-02 ENCOUNTER — Encounter: Payer: Self-pay | Admitting: Family Medicine

## 2022-11-02 ENCOUNTER — Other Ambulatory Visit (HOSPITAL_BASED_OUTPATIENT_CLINIC_OR_DEPARTMENT_OTHER): Payer: Self-pay

## 2022-11-02 VITALS — BP 105/88 | HR 72 | Ht 61.0 in | Wt 108.0 lb

## 2022-11-02 DIAGNOSIS — R8290 Unspecified abnormal findings in urine: Secondary | ICD-10-CM

## 2022-11-02 DIAGNOSIS — Z3009 Encounter for other general counseling and advice on contraception: Secondary | ICD-10-CM | POA: Diagnosis not present

## 2022-11-02 DIAGNOSIS — F418 Other specified anxiety disorders: Secondary | ICD-10-CM

## 2022-11-02 DIAGNOSIS — K2900 Acute gastritis without bleeding: Secondary | ICD-10-CM

## 2022-11-02 DIAGNOSIS — R110 Nausea: Secondary | ICD-10-CM | POA: Diagnosis not present

## 2022-11-02 DIAGNOSIS — N3000 Acute cystitis without hematuria: Secondary | ICD-10-CM | POA: Diagnosis not present

## 2022-11-02 LAB — POCT URINALYSIS DIP (CLINITEK)
Bilirubin, UA: NEGATIVE
Blood, UA: NEGATIVE
Glucose, UA: NEGATIVE mg/dL
Ketones, POC UA: NEGATIVE mg/dL
Nitrite, UA: POSITIVE — AB
POC PROTEIN,UA: NEGATIVE
Spec Grav, UA: 1.025 (ref 1.010–1.025)
Urobilinogen, UA: 0.2 E.U./dL
pH, UA: 6.5 (ref 5.0–8.0)

## 2022-11-02 MED ORDER — LANSOPRAZOLE 30 MG PO CPDR
30.0000 mg | DELAYED_RELEASE_CAPSULE | Freq: Every morning | ORAL | 0 refills | Status: DC
  Filled 2022-11-02: qty 90, 90d supply, fill #0

## 2022-11-02 MED ORDER — NITROFURANTOIN MONOHYD MACRO 100 MG PO CAPS
100.0000 mg | ORAL_CAPSULE | Freq: Two times a day (BID) | ORAL | 0 refills | Status: DC
  Filled 2022-11-02: qty 10, 5d supply, fill #0

## 2022-11-02 MED ORDER — NORELGESTROMIN-ETH ESTRADIOL 150-35 MCG/24HR TD PTWK
1.0000 | MEDICATED_PATCH | TRANSDERMAL | 4 refills | Status: DC
  Filled 2022-11-02 – 2022-11-05 (×2): qty 9, 63d supply, fill #0
  Filled 2023-01-25: qty 9, 63d supply, fill #1
  Filled 2023-04-12: qty 9, 63d supply, fill #2
  Filled 2023-07-21: qty 9, 63d supply, fill #3
  Filled 2023-10-10: qty 9, 63d supply, fill #4

## 2022-11-02 MED ORDER — HYDROXYZINE HCL 25 MG PO TABS
25.0000 mg | ORAL_TABLET | Freq: Three times a day (TID) | ORAL | 0 refills | Status: DC | PRN
  Filled 2022-11-02 (×2): qty 30, 5d supply, fill #0

## 2022-11-02 NOTE — Patient Instructions (Signed)
Go ahead and treat the urinary tract infection with Macrobid.  But if symptoms do not improve then try stopping the probiotic to see if that resolves the symptoms.  If that is not helpful then please let us know and we will need to recollect a urine specimen and a culture.  Okay to go ahead and start the Prevacid.  Okay to cut the end of the capsule and sprinkle on your tongue or a little bit of food and swallow.  Please start taking that daily for a total of 6 weeks and then okay to taper off if doing well.  If you continue to get intermittent nausea please let [place] know.  Did update your birth control prescription so hopefully the next 1 will send you a 90-day supply.

## 2022-11-02 NOTE — Assessment & Plan Note (Signed)
Continue with current regimen for now continue with therapy/counseling with Olegario Messier.

## 2022-11-02 NOTE — Progress Notes (Signed)
Acute Office Visit  Subjective:     Patient ID: Brenda Mendoza, female    DOB: 01/13/2001, 22 y.o.   MRN: [id number]  Chief Complaint  Patient presents with   Nausea   abnormal urine odor    HPI Patient is in today for Nausea on an off after eating for a couple of month. No abdominal pain.  This happened a few to times since the very first time about 2 months ago.  She has noticed that recently drinking alcohol seems to cause some nausea as well.  She does not have a daily bowel movement usually goes a couple times a week but says the stools are soft she does not really strain and it is not unusual for her.  No belching or recent GERD symptoms.  She has had a prior history of gastritis and gastric ulcers.  3 days of strong urine odor. No dysuria or fever or chills.  She has noticed a little bit more frequency but no urgency.  She did start a probiotic for her stomach about a week ago.  Regards to depression and anxiety-she feels like overall she is doing well she has been struggling a little bit with school lately.  But feels like she is actually coming out of it mild depression.  She did start seeing one of our therapists.  ROS      Objective:    BP 105/88   Pulse 72   Ht 5\' 1"  (1.549 m)   Wt 108 lb (49 kg)   SpO2 100%   BMI 20.41 kg/m    Physical Exam Vitals reviewed.  Constitutional:      Appearance: She is well-developed.  HENT:     Head: Normocephalic and atraumatic.  Eyes:     Conjunctiva/sclera: Conjunctivae normal.  Cardiovascular:     Rate and Rhythm: Normal rate.  Pulmonary:     Effort: Pulmonary effort is normal.  Abdominal:     General: Bowel sounds are normal. There is no distension.     Palpations: Abdomen is soft.     Tenderness: There is no abdominal tenderness.  Skin:    General: Skin is dry.     Coloration: Skin is not pale.  Neurological:     Mental Status: She is alert and oriented to person, place, and time.  Psychiatric:         Behavior: Behavior normal.     Results for orders placed or performed in visit on 11/02/22  POCT URINALYSIS DIP (CLINITEK)  Result Value Ref Range   Color, UA yellow yellow   Clarity, UA cloudy (A) clear   Glucose, UA negative negative mg/dL   Bilirubin, UA negative negative   Ketones, POC UA negative negative mg/dL   Spec Grav, UA 2.130 8.657 - 1.025   Blood, UA negative negative   pH, UA 6.5 5.0 - 8.0   POC PROTEIN,UA negative negative, trace   Urobilinogen, UA 0.2 0.2 or 1.0 E.U./dL   Nitrite, UA Positive (A) Negative   Leukocytes, UA Trace (A) Negative        Assessment & Plan:   Problem List Items Addressed This Visit       Other   Nausea   Depression with anxiety    Continue with current regimen for now continue with therapy/counseling with Olegario Messier.      Other Visit Diagnoses     Abnormal urine odor    -  Primary   Relevant Orders   POCT URINALYSIS  DIP (CLINITEK) (Completed)   Urine Culture   Acute gastritis without hemorrhage, unspecified gastritis type       Encounter for counseling regarding contraception       Relevant Medications   norelgestromin-ethinyl estradiol [facility]) 150-35 MCG/24HR transdermal patch   Acute cystitis without hematuria       Relevant Medications   nitrofurantoin, macrocrystal-monohydrate, (MACROBID) 100 MG capsule      Nausea-possible gastritis versus gastric ulcer.  This is not having any pain or active GERD symptoms.  Will go ahead and treat with Prevacid she has difficulty swallowing larger pills so okay to cut the tip of the capsule and pour onto her tongue.  Urinary odor-nitrites positive on the dipstick we will treat for acute cystitis.  If not improving then recommend stop probiotic which she just started about a week ago and if symptoms still not improving then please let us know and we can workup further.  She has been really happy with her birth control patch.  Meds ordered this encounter  Medications   lansoprazole  (PREVACID) 30 MG capsule    Sig: Take 1 capsule (30 mg total) by mouth in the morning. Ok to open capsule and swallow    Dispense:  90 capsule    Refill:  0   norelgestromin-ethinyl estradiol (XULANE) 150-35 MCG/24HR transdermal patch    Sig: Place 1 patch onto the skin once a week.    Dispense:  9 patch    Refill:  4   nitrofurantoin, macrocrystal-monohydrate, (MACROBID) 100 MG capsule    Sig: Take 1 capsule (100 mg total) by mouth 2 (two) times daily.    Dispense:  10 capsule    Refill:  0    Return in about 4 months (around 03/05/2023) for Mood, etc.  Nani Gasser, MD

## 2022-11-03 ENCOUNTER — Other Ambulatory Visit: Payer: Self-pay

## 2022-11-05 ENCOUNTER — Other Ambulatory Visit (HOSPITAL_BASED_OUTPATIENT_CLINIC_OR_DEPARTMENT_OTHER): Payer: Self-pay

## 2022-11-05 NOTE — Progress Notes (Signed)
HI Brenda Mendoza,  Urine culture came back positive for E. coli.  The antibiotic that I put you on should work well to clear this up.  Just make sure to complete the prescription.

## 2022-11-18 ENCOUNTER — Encounter: Payer: Self-pay | Admitting: Professional

## 2022-11-18 ENCOUNTER — Ambulatory Visit (INDEPENDENT_AMBULATORY_CARE_PROVIDER_SITE_OTHER): Payer: Commercial Managed Care - PPO | Admitting: Professional

## 2022-11-18 DIAGNOSIS — F900 Attention-deficit hyperactivity disorder, predominantly inattentive type: Secondary | ICD-10-CM | POA: Diagnosis not present

## 2022-11-18 DIAGNOSIS — F840 Autistic disorder: Secondary | ICD-10-CM | POA: Diagnosis not present

## 2022-11-18 DIAGNOSIS — F411 Generalized anxiety disorder: Secondary | ICD-10-CM | POA: Diagnosis not present

## 2022-11-18 DIAGNOSIS — F418 Other specified anxiety disorders: Secondary | ICD-10-CM

## 2022-11-18 NOTE — Progress Notes (Signed)
° ° ° ° ° ° ° ° ° ° ° ° ° ° °  Kathy Collins, LCMHC °

## 2022-11-18 NOTE — Progress Notes (Addendum)
[facility] Counselor/Therapist Progress Note  Patient ID: Brenda Mendoza, MRN: [id number],    Date: 11/30/2022  Time Spent: 46 minutes 105-151pm   Treatment Type: Individual Therapy  Risk Assessment: Danger to Self:  No Self-injurious Behavior: No Danger to Others: No  Subjective: The patient arrived on time for her in-person session.   Issues addressed: -treatment planning -patient and Clinician developed patient's treatment plan -patient fully participated and agrees with her treatment plan  Treatment Plan Problems Addressed  Anxiety, Attention Deficit Disorder (ADD) - Adult, Eating Disorders And Obesity, Low Self-Esteem, Unipolar Depression  Goals 1. Achieve a satisfactory level of balance, structure, and intimacy in personal life. 2. Alleviate depressive symptoms and return to previous level of effective functioning. Objective Learn and implement problem-solving and decision-making skills. Target Date: 2023-11-18 Frequency: Biweekly  Progress: 0 Modality: individual  Objective Verbalize insight into how past relationships may be influencing current experiences with depression. Target Date: 2023-11-18 Frequency: Biweekly  Progress: 0 Modality: individual  Related Interventions Explore experiences from the client's childhood that contribute to current depressed state. Encourage the client to share feelings of anger regarding pain inflicted on him/her in childhood that contributed to current depressed state. Explain a connection between previously unexpressed (repressed) feelings of anger (and helplessness) and current state of depression. Objective Learn and implement conflict resolution skills to resolve interpersonal problems. Target Date: 2023-11-18 Frequency: Biweekly  Progress: 0 Modality: individual  Related Interventions Teach conflict resolution skills (e.g., empathy, active listening, "I messages," respectful communication, assertiveness  without aggression, compromise); use psychoeducation, modeling, role-playing, and rehearsal to work through several current conflicts; assign homework exercises; review and repeat so as to integrate their use into the client's life. Help the client resolve depression related to interpersonal problems through the use of reassurance and support, clarification of cognitive and affective triggers that ignite conflicts, and active problem-solving (or assign "Applying Problem-Solving to Interpersonal Conflict" in the Adult Psychotherapy Homework Planner by Stephannie Li). Objective Verbalize an understanding and resolution of current interpersonal problems. Target Date: 2023-11-18 Frequency: Biweekly  Progress: 0 Modality: individual  Related Interventions For interpersonal disputes, help the client explore the relationship, the nature of the dispute, whether it has reached an impasse, and available options to resolve it including learning and implementing conflict-resolution skills; if the relationship has reached an impasse, consider ways to change the impasse or to end the relationship. For interpersonal deficits, help the client develop new interpersonal skills and relationships. Objective Learn and implement behavioral strategies to overcome depression. Target Date: 2023-11-18 Frequency: Biweekly  Progress: 0 Modality: individual  Related Interventions Engage the client in "behavioral activation," increasing his/her activity level and contact with sources of reward, while identifying processes that inhibit activation (see Behavioral Activation for Depression by Katharine Look, Dimidjian, and Herman-Dunn; or assign "Identify and Schedule Pleasant Activities" in the Adult Psychotherapy Homework Planner by [facility]); use behavioral techniques such as instruction, rehearsal, role-playing, role reversal, as needed, to facilitate activity in the client's daily life; reinforce success. Assist the client in developing  skills that increase the likelihood of deriving pleasure from behavioral activation (e.g., assertiveness skills, developing an exercise plan, less internal/more external focus, increased social involvement); reinforce success. 3. Develop a consistent, positive self-image. 4. Develop coping strategies (e.g., feeling identification, problem-solving, assertiveness) to address emotional issues that could lead to relapse of the eating disorder. 5. Develop healthy cognitive patterns and beliefs about self that lead to positive identity and prevent a relapse of the eating disorder. 6. Develop healthy interpersonal relationships that  lead to alleviation and help prevent the relapse of the eating disorder. Objective Identify, challenge, and replace self-talk and beliefs that promote the anorexia or bulimia. Target Date: 2023-11-18 Frequency: Biweekly  Progress: 0 Modality: individual  Objective Learn and implement skills for managing urges to engage in unhealthy eating or weight loss practices. Target Date: 2023-11-18 Frequency: Biweekly  Progress: 0 Modality: individual  Related Interventions Teach the client tailored skills to manage high-risk situations including distraction, positive self-talk, problem-solving, conflict resolution (e.g., empathy, active listening, "I messages," respectful communication, assertiveness without aggression, compromise), or other social/ communication skills; use modeling, role-playing, and behavior rehearsal to work through several current situations. Objective State a basis for positive identity that is not based on weight and appearance but on character, traits, relationships, and intrinsic value. Target Date: 2023-11-18 Frequency: Biweekly  Progress: 0 Modality: individual  Related Interventions Assist the client in identifying a basis for self-worth apart from body image by reviewing his/her talents, successes, positive traits, importance to others, and intrinsic  spiritual value. Objective Verbalize an understanding of relapse prevention and the distinction between a lapse and a relapse. Target Date: 2023-11-18 Frequency: Biweekly  Progress: 0 Modality: individual  Related Interventions Discuss with the client the distinction between a lapse and relapse, associating a lapse with an initial and reversible return of distress, urges, or to avoid, and relapse with the decision to return to the cycle of maladaptive thoughts and actions (e.g., feeling anxious, binging, then purging). Identify with the client future situations or circumstances in which lapses could occur. 7. Develop healthy interpersonal relationships that lead to the alleviation and help prevent the relapse of depression. 8. Develop healthy thinking patterns and beliefs about self, others, and the world that lead to the alleviation and help prevent the relapse of depression. 9. Elevate self-esteem. Objective Identify and replace negative self-talk messages used to reinforce low self-esteem. Target Date: 2023-11-18 Frequency: Biweekly  Progress: 0 Modality: individual  Related Interventions Help the client identify his/her distorted, negative beliefs about self and the world and replace these messages with more realistic, affirmative messages (or assign "Journal and Replace Self-Defeating Thoughts" in the Adult Psychotherapy Homework Planner by [facility] or read What to Say When You Talk to Yourself by [employer]). Ask the client to complete and process self-esteem-building exercises from recommended self-help books (e.g., Ten Days to Self Esteem! by Lawerance Bach; The Self-Esteem Companion by Murvin Donning, [place], and [employer]; 10 Simple Solutions for [employer] by [employer]). Objective Identify positive traits and talents about self. Target Date: 2023-11-18 Frequency: Biweekly  Progress: 0 Modality: individual  Related Interventions Assign the client the exercise of identifying his/her  positive physical characteristics in a mirror to help him/her become more comfortable with himself/herself. Ask the client to keep building a list of positive traits and have him/her read the list at the beginning and end of each session (or assign "Acknowledging My Strengths" or "What Are My Good Qualities?" in the Adult Psychotherapy Homework Planner by [facility]); reinforce the client's positive self-descriptive statements. Objective Positively acknowledge verbal compliments from others. Target Date: 2023-11-18 Frequency: Biweekly  Progress: 0 Modality: individual  Related Interventions Assign the client to be aware of and acknowledge graciously (without discounting) praise and compliments from others. Objective Demonstrate an increased ability to identify and express personal feelings. Target Date: 2023-11-18 Frequency: Biweekly  Progress: 0 Modality: individual  Related Interventions Assist the client in identifying and labeling emotions. Assign the client to keep a journal of feelings on a daily basis. Objective Decrease  the verbalized fear of rejection while increasing statements of self-acceptance. Target Date: 2023-11-18 Frequency: Biweekly  Progress: 0 Modality: individual  Related Interventions Ask the client to make one positive statement about himself/herself daily and record it on a chart or in a journal) or assign "Replacing Fears with Positive Messages" in the Adult Psychotherapy Homework Planner by Stephannie Li). 10. Enhance ability to effectively cope with the full variety of life's worries and anxieties. 11. Establish an inward sense of self-worth, confidence, and competence. 12. Learn and implement coping skills that result in a reduction of anxiety and worry, and improved daily functioning. 13. Minimize ADD behavioral interference in daily life. Objective Identify the current specific ADD behaviors that cause the most difficulty. Target Date: 2023-11-18 Frequency: Biweekly   Progress: 0 Modality: individual  Related Interventions Assist the client in identifying the current specific behaviors that cause him/her the most difficulty functioning as part of identifying treatment targets (i.e., a functional analysis). Objective Increase knowledge of ADHD and its treatment. Target Date: 2023-11-18 Frequency: Biweekly  Progress: 0 Modality: individual  Related Interventions Educate the client about the signs and symptoms of ADHD and how they disrupt functioning through the influence of distractibility, poor planning and organization, maladaptive thinking, frustration, impulsivity, and possible procrastination. Objective Identify, challenge, and change self-talk that contributes to maladaptive feelings and actions. Target Date: 2023-11-18 Frequency: Biweekly  Progress: 0 Modality: individual  Related Interventions Use cognitive therapy techniques to help client identify maladaptive self-talk (e.g., "I must do this perfectly," "I can do this later," "I can't organize all these things"); challenge biases, and generate alternatives. Assign homework asking client to implement cognitive restructuring skills while doing tasks in which maladaptive thinking has occurred previously; review and provide corrective feedback toward improving the skills. Objective List coping skills that will be used to manage ADD symptoms. Target Date: 2023-11-18 Frequency: Biweekly  Progress: 0 Modality: individual  Related Interventions Review with the client the symptoms that have been problematic and the newly learned coping skills he/she will use to manage the symptoms (or assign "Symptoms and Fixes for ADD" in the Adult Psychotherapy Homework Planner by Stephannie Li). Objective Report improved listening skills without defensiveness. Target Date: 2023-11-18 Frequency: Biweekly  Progress: 0 Modality: individual  Related Interventions Use role-playing and modeling to teach the client how to listen  and accept feedback from others regarding his/her behavior. 14. Recognize, accept, and cope with feelings of depression. 15. Reduce overall frequency, intensity, and duration of the anxiety so that daily functioning is not impaired. Objective Learn and implement calming skills to reduce overall anxiety and manage anxiety symptoms. Target Date: 2023-11-18 Frequency: Biweekly  Progress: 0 Modality: individual  Objective Verbalize an understanding of the cognitive, physiological, and behavioral components of anxiety and its treatment. Target Date: 2023-11-18 Frequency: Biweekly  Progress: 0 Modality: individual  Objective Learn and implement problem-solving strategies for realistically addressing worries. Target Date: 2023-11-18 Frequency: Biweekly  Progress: 0 Modality: individual  Objective Learn to accept limitations in life and commit to tolerating, rather than avoiding, unpleasant emotions while accomplishing meaningful goals. Target Date: 2023-11-18 Frequency: Biweekly  Progress: 0 Modality: individual  Objective Identify and engage in pleasant activities on a daily basis. Target Date: 2023-11-18 Frequency: Biweekly  Progress: 0 Modality: individual  Objective Verbalize an understanding of the role that cognitive biases play in excessive irrational worry and persistent anxiety symptoms. Target Date: 2023-11-18 Frequency: Biweekly  Progress: 0 Modality: individual  Objective Describe situations, thoughts, feelings, and actions associated with anxieties and worries, their impact  on functioning, and attempts to resolve them. Target Date: 2023-11-18 Frequency: Biweekly  Progress: 0 Modality: individual  16. Restore normal eating patterns, healthy weight maintenance, and a realistic appraisal of body size.  Diagnosis: Generalized anxiety disorder  Attention Deficit Hyperactivity Disorder  Autism Spectrum Disorder  Plan:  -pt is to become more aware of her negative thoughts and  verbalizations -meet again on Thursday, December 02, 2022 at 1pm.

## 2022-12-02 ENCOUNTER — Ambulatory Visit: Payer: Commercial Managed Care - PPO | Admitting: Professional

## 2022-12-16 ENCOUNTER — Encounter: Payer: Self-pay | Admitting: Professional

## 2022-12-16 ENCOUNTER — Ambulatory Visit (INDEPENDENT_AMBULATORY_CARE_PROVIDER_SITE_OTHER): Payer: Commercial Managed Care - PPO | Admitting: Professional

## 2022-12-16 DIAGNOSIS — F418 Other specified anxiety disorders: Secondary | ICD-10-CM | POA: Diagnosis not present

## 2022-12-16 DIAGNOSIS — F411 Generalized anxiety disorder: Secondary | ICD-10-CM | POA: Diagnosis not present

## 2022-12-16 DIAGNOSIS — F900 Attention-deficit hyperactivity disorder, predominantly inattentive type: Secondary | ICD-10-CM | POA: Diagnosis not present

## 2022-12-16 DIAGNOSIS — F840 Autistic disorder: Secondary | ICD-10-CM

## 2022-12-16 NOTE — Progress Notes (Signed)
[facility] Counselor/Therapist Progress Note  Patient ID: Brenda Mendoza, MRN: [id number],    Date: 12/16/2022  Time Spent: 46 minutes 105-151pm   Treatment Type: Individual Therapy  Risk Assessment: Danger to Self:  No Self-injurious Behavior: No Danger to Others: No  Subjective: The patient arrived on time for her in-person session.   Issues addressed: 1-homework -pt is to become more aware of her negative thoughts and verbalizations -pt has had a lot of anxiety lately -she has been having increased over the last week   -pt attributes it to the stress of grandaution -pt catches herself talking bad about herself and has been  2-graduated hair school -was thankful to be done -pt felt highly criticized by her instructors constantly 3-communicating effectively -educated and examples -how to cope with negative thinking -how to be assertive  Treatment Plan Problems Addressed  Anxiety, Attention Deficit Disorder (ADD) - Adult, Eating Disorders And Obesity, Low Self-Esteem, Unipolar Depression  Goals 1. Achieve a satisfactory level of balance, structure, and intimacy in personal life. 2. Alleviate depressive symptoms and return to previous level of effective functioning. Objective Learn and implement problem-solving and decision-making skills. Target Date: 2023-11-18 Frequency: Biweekly  Progress: 0 Modality: individual  Objective Verbalize insight into how past relationships may be influencing current experiences with depression. Target Date: 2023-11-18 Frequency: Biweekly  Progress: 0 Modality: individual  Related Interventions Explore experiences from the client's childhood that contribute to current depressed state. Encourage the client to share feelings of anger regarding pain inflicted on him/her in childhood that contributed to current depressed state. Explain a connection between previously unexpressed (repressed) feelings of anger (and  helplessness) and current state of depression. Objective Learn and implement conflict resolution skills to resolve interpersonal problems. Target Date: 2023-11-18 Frequency: Biweekly  Progress: 0 Modality: individual  Related Interventions Teach conflict resolution skills (e.g., empathy, active listening, "I messages," respectful communication, assertiveness without aggression, compromise); use psychoeducation, modeling, role-playing, and rehearsal to work through several current conflicts; assign homework exercises; review and repeat so as to integrate their use into the client's life. Help the client resolve depression related to interpersonal problems through the use of reassurance and support, clarification of cognitive and affective triggers that ignite conflicts, and active problem-solving (or assign "Applying Problem-Solving to Interpersonal Conflict" in the Adult Psychotherapy Homework Planner by Stephannie Li). Objective Verbalize an understanding and resolution of current interpersonal problems. Target Date: 2023-11-18 Frequency: Biweekly  Progress: 0 Modality: individual  Related Interventions For interpersonal disputes, help the client explore the relationship, the nature of the dispute, whether it has reached an impasse, and available options to resolve it including learning and implementing conflict-resolution skills; if the relationship has reached an impasse, consider ways to change the impasse or to end the relationship. For interpersonal deficits, help the client develop new interpersonal skills and relationships. Objective Learn and implement behavioral strategies to overcome depression. Target Date: 2023-11-18 Frequency: Biweekly  Progress: 0 Modality: individual  Related Interventions Engage the client in "behavioral activation," increasing his/her activity level and contact with sources of reward, while identifying processes that inhibit activation (see Behavioral Activation for  Depression by Katharine Look, Dimidjian, and Herman-Dunn; or assign "Identify and Schedule Pleasant Activities" in the Adult Psychotherapy Homework Planner by [facility]); use behavioral techniques such as instruction, rehearsal, role-playing, role reversal, as needed, to facilitate activity in the client's daily life; reinforce success. Assist the client in developing skills that increase the likelihood of deriving pleasure from behavioral activation (e.g., assertiveness skills, developing an exercise plan, less  internal/more external focus, increased social involvement); reinforce success. 3. Develop a consistent, positive self-image. 4. Develop coping strategies (e.g., feeling identification, problem-solving, assertiveness) to address emotional issues that could lead to relapse of the eating disorder. 5. Develop healthy cognitive patterns and beliefs about self that lead to positive identity and prevent a relapse of the eating disorder. 6. Develop healthy interpersonal relationships that lead to alleviation and help prevent the relapse of the eating disorder. Objective Identify, challenge, and replace self-talk and beliefs that promote the anorexia or bulimia. Target Date: 2023-11-18 Frequency: Biweekly  Progress: 0 Modality: individual  Objective Learn and implement skills for managing urges to engage in unhealthy eating or weight loss practices. Target Date: 2023-11-18 Frequency: Biweekly  Progress: 0 Modality: individual  Related Interventions Teach the client tailored skills to manage high-risk situations including distraction, positive self-talk, problem-solving, conflict resolution (e.g., empathy, active listening, "I messages," respectful communication, assertiveness without aggression, compromise), or other social/ communication skills; use modeling, role-playing, and behavior rehearsal to work through several current situations. Objective State a basis for positive identity that is not based on  weight and appearance but on character, traits, relationships, and intrinsic value. Target Date: 2023-11-18 Frequency: Biweekly  Progress: 0 Modality: individual  Related Interventions Assist the client in identifying a basis for self-worth apart from body image by reviewing his/her talents, successes, positive traits, importance to others, and intrinsic spiritual value. Objective Verbalize an understanding of relapse prevention and the distinction between a lapse and a relapse. Target Date: 2023-11-18 Frequency: Biweekly  Progress: 0 Modality: individual  Related Interventions Discuss with the client the distinction between a lapse and relapse, associating a lapse with an initial and reversible return of distress, urges, or to avoid, and relapse with the decision to return to the cycle of maladaptive thoughts and actions (e.g., feeling anxious, binging, then purging). Identify with the client future situations or circumstances in which lapses could occur. 7. Develop healthy interpersonal relationships that lead to the alleviation and help prevent the relapse of depression. 8. Develop healthy thinking patterns and beliefs about self, others, and the world that lead to the alleviation and help prevent the relapse of depression. 9. Elevate self-esteem. Objective Identify and replace negative self-talk messages used to reinforce low self-esteem. Target Date: 2023-11-18 Frequency: Biweekly  Progress: 0 Modality: individual  Related Interventions Help the client identify his/her distorted, negative beliefs about self and the world and replace these messages with more realistic, affirmative messages (or assign "Journal and Replace Self-Defeating Thoughts" in the Adult Psychotherapy Homework Planner by [facility] or read What to Say When You Talk to Yourself by [employer]). Ask the client to complete and process self-esteem-building exercises from recommended self-help books (e.g., Ten Days to Self  Esteem! by Lawerance Bach; The Self-Esteem Companion by Murvin Donning, [place], and [employer]; 10 Simple Solutions for [employer] by [employer]). Objective Identify positive traits and talents about self. Target Date: 2023-11-18 Frequency: Biweekly  Progress: 0 Modality: individual  Related Interventions Assign the client the exercise of identifying his/her positive physical characteristics in a mirror to help him/her become more comfortable with himself/herself. Ask the client to keep building a list of positive traits and have him/her read the list at the beginning and end of each session (or assign "Acknowledging My Strengths" or "What Are My Good Qualities?" in the Adult Psychotherapy Homework Planner by [facility]); reinforce the client's positive self-descriptive statements. Objective Positively acknowledge verbal compliments from others. Target Date: 2023-11-18 Frequency: Biweekly  Progress: 0 Modality: individual  Related Interventions  Assign the client to be aware of and acknowledge graciously (without discounting) praise and compliments from others. Objective Demonstrate an increased ability to identify and express personal feelings. Target Date: 2023-11-18 Frequency: Biweekly  Progress: 0 Modality: individual  Related Interventions Assist the client in identifying and labeling emotions. Assign the client to keep a journal of feelings on a daily basis. Objective Decrease the verbalized fear of rejection while increasing statements of self-acceptance. Target Date: 2023-11-18 Frequency: Biweekly  Progress: 0 Modality: individual  Related Interventions Ask the client to make one positive statement about himself/herself daily and record it on a chart or in a journal) or assign "Replacing Fears with Positive Messages" in the Adult Psychotherapy Homework Planner by Stephannie Li). 10. Enhance ability to effectively cope with the full variety of life's worries and anxieties. 11. Establish an  inward sense of self-worth, confidence, and competence. 12. Learn and implement coping skills that result in a reduction of anxiety and worry, and improved daily functioning. 13. Minimize ADD behavioral interference in daily life. Objective Identify the current specific ADD behaviors that cause the most difficulty. Target Date: 2023-11-18 Frequency: Biweekly  Progress: 0 Modality: individual  Related Interventions Assist the client in identifying the current specific behaviors that cause him/her the most difficulty functioning as part of identifying treatment targets (i.e., a functional analysis). Objective Increase knowledge of ADHD and its treatment. Target Date: 2023-11-18 Frequency: Biweekly  Progress: 0 Modality: individual  Related Interventions Educate the client about the signs and symptoms of ADHD and how they disrupt functioning through the influence of distractibility, poor planning and organization, maladaptive thinking, frustration, impulsivity, and possible procrastination. Objective Identify, challenge, and change self-talk that contributes to maladaptive feelings and actions. Target Date: 2023-11-18 Frequency: Biweekly  Progress: 0 Modality: individual  Related Interventions Use cognitive therapy techniques to help client identify maladaptive self-talk (e.g., "I must do this perfectly," "I can do this later," "I can't organize all these things"); challenge biases, and generate alternatives. Assign homework asking client to implement cognitive restructuring skills while doing tasks in which maladaptive thinking has occurred previously; review and provide corrective feedback toward improving the skills. Objective List coping skills that will be used to manage ADD symptoms. Target Date: 2023-11-18 Frequency: Biweekly  Progress: 0 Modality: individual  Related Interventions Review with the client the symptoms that have been problematic and the newly learned coping skills he/she  will use to manage the symptoms (or assign "Symptoms and Fixes for ADD" in the Adult Psychotherapy Homework Planner by Stephannie Li). Objective Report improved listening skills without defensiveness. Target Date: 2023-11-18 Frequency: Biweekly  Progress: 0 Modality: individual  Related Interventions Use role-playing and modeling to teach the client how to listen and accept feedback from others regarding his/her behavior. 14. Recognize, accept, and cope with feelings of depression. 15. Reduce overall frequency, intensity, and duration of the anxiety so that daily functioning is not impaired. Objective Learn and implement calming skills to reduce overall anxiety and manage anxiety symptoms. Target Date: 2023-11-18 Frequency: Biweekly  Progress: 0 Modality: individual  Objective Verbalize an understanding of the cognitive, physiological, and behavioral components of anxiety and its treatment. Target Date: 2023-11-18 Frequency: Biweekly  Progress: 0 Modality: individual  Objective Learn and implement problem-solving strategies for realistically addressing worries. Target Date: 2023-11-18 Frequency: Biweekly  Progress: 0 Modality: individual  Objective Learn to accept limitations in life and commit to tolerating, rather than avoiding, unpleasant emotions while accomplishing meaningful goals. Target Date: 2023-11-18 Frequency: Biweekly  Progress: 0 Modality: individual  Objective Identify  and engage in pleasant activities on a daily basis. Target Date: 2023-11-18 Frequency: Biweekly  Progress: 0 Modality: individual  Objective Verbalize an understanding of the role that cognitive biases play in excessive irrational worry and persistent anxiety symptoms. Target Date: 2023-11-18 Frequency: Biweekly  Progress: 0 Modality: individual  Objective Describe situations, thoughts, feelings, and actions associated with anxieties and worries, their impact on functioning, and attempts to resolve  them. Target Date: 2023-11-18 Frequency: Biweekly  Progress: 0 Modality: individual  16. Restore normal eating patterns, healthy weight maintenance, and a realistic appraisal of body size.  Diagnosis: Generalized anxiety disorder  Attention Deficit Hyperactivity Disorder  Autism Spectrum Disorder  Plan:  -meet again on Thursday, December 30, 2022 at 3pm.

## 2022-12-30 ENCOUNTER — Ambulatory Visit: Payer: Commercial Managed Care - PPO | Admitting: Professional

## 2023-01-06 ENCOUNTER — Encounter: Payer: Self-pay | Admitting: Family Medicine

## 2023-01-06 ENCOUNTER — Other Ambulatory Visit (HOSPITAL_BASED_OUTPATIENT_CLINIC_OR_DEPARTMENT_OTHER): Payer: Self-pay

## 2023-01-06 ENCOUNTER — Ambulatory Visit: Payer: Commercial Managed Care - PPO | Admitting: Family Medicine

## 2023-01-06 VITALS — BP 95/48 | HR 72 | Ht 61.0 in | Wt 108.0 lb

## 2023-01-06 DIAGNOSIS — R300 Dysuria: Secondary | ICD-10-CM

## 2023-01-06 DIAGNOSIS — K2100 Gastro-esophageal reflux disease with esophagitis, without bleeding: Secondary | ICD-10-CM | POA: Diagnosis not present

## 2023-01-06 DIAGNOSIS — G43109 Migraine with aura, not intractable, without status migrainosus: Secondary | ICD-10-CM

## 2023-01-06 DIAGNOSIS — N390 Urinary tract infection, site not specified: Secondary | ICD-10-CM | POA: Diagnosis not present

## 2023-01-06 DIAGNOSIS — F900 Attention-deficit hyperactivity disorder, predominantly inattentive type: Secondary | ICD-10-CM | POA: Diagnosis not present

## 2023-01-06 DIAGNOSIS — G43009 Migraine without aura, not intractable, without status migrainosus: Secondary | ICD-10-CM

## 2023-01-06 DIAGNOSIS — N760 Acute vaginitis: Secondary | ICD-10-CM

## 2023-01-06 DIAGNOSIS — F418 Other specified anxiety disorders: Secondary | ICD-10-CM | POA: Diagnosis not present

## 2023-01-06 DIAGNOSIS — R110 Nausea: Secondary | ICD-10-CM | POA: Diagnosis not present

## 2023-01-06 MED ORDER — TOPIRAMATE 25 MG PO TABS
25.0000 mg | ORAL_TABLET | Freq: Every day | ORAL | 1 refills | Status: DC
  Filled 2023-01-06: qty 90, 90d supply, fill #0

## 2023-01-06 NOTE — Assessment & Plan Note (Signed)
Not currently on medication for ADD.

## 2023-01-06 NOTE — Progress Notes (Signed)
Established Patient Office Visit  Subjective   Patient ID: EEVEE WHITEHORSE, female    DOB: 14-May-2000  Age: 22 y.o. MRN: [id number]  Chief Complaint  Patient presents with   Medical Management of Chronic Issues    Indigestion     HPI  ADD -not currently on medication.  Not in school right now.  She feels she is nauseated all the time and feels like not digesting food well. Beter but not resolved.   She is currently on Prevacid 30 mg capsule daily.  She does feel like her stress was definitely contributing to some of her symptoms and now that she is less stressed it has been better.  Follow-up mood-currently on sertraline 100 mg daily.  Also wanted discuss recurrent UTIs.  She Feels like she has someone now with little bit of irritation.  Last UTI was about 2 months ago she says that her mom and her sister both get UTIs more frequently as well she is also had a little bit of discomfort after intercourse and had some pain after 1 particular episode of intercourse.     ROS    Objective:     BP (!) 95/48   Pulse 72   Ht 5\' 1"  (1.549 m)   Wt 108 lb (49 kg)   SpO2 99%   BMI 20.41 kg/m    Physical Exam   No results found for any visits on 01/06/23.    The ASCVD Risk score (Arnett DK, et al., 2019) failed to calculate for the following reasons:   The 2019 ASCVD risk score is only valid for ages 86 to 32    Assessment & Plan:   Problem List Items Addressed This Visit       Cardiovascular and Mediastinum   Migraine without aura   Relevant Medications   topiramate (TOPAMAX) 25 MG tablet     Other   Nausea    Could be that the GERD is just not well-controlled we did discuss stopping any type of acid medication and then checking for H. pylori in about a week.  She would need to avoid any food gum drinks etc. for about an hour before she comes in for testing.  Also consider it could be a medication side effect.  So that something for [place] to consider we did add  Topamax earlier this summer and one of the side effects is nausea and decreased appetite.  Her weight is stable so that is good.      Depression with anxiety    Doing well with current regimen overall continue sertraline 100 mg daily.      ADD (attention deficit disorder) - Primary    Not currently on medication for ADD.      Other Visit Diagnoses     Acute vaginitis       Relevant Orders   BV+Ct/Ng/Tv NAA+Yeast Culture   Gastroesophageal reflux disease with esophagitis without hemorrhage       Relevant Orders   H. pylori breath test   Dysuria       Relevant Orders   Urinalysis, Routine w reflex microscopic   Recurrent UTI       Migraine with aura and without status migrainosus, not intractable  (Chronic)      Relevant Medications   topiramate (TOPAMAX) 25 MG tablet      Vaginitis -she is currently on her period so will come back in about a week to do a vaginal swab as well as check  for UTI.  Recurrent UTI-we did discuss some preventative measures that can be helpful.  We also discussed that sometimes prescription antibiotics can be used after intercourse to or daily for prolonged period to help reduce frequency.  Right now recommend that we get her urine evaluated to see if she actively is having UTI and start keeping track of how often and how frequent.  Encouraged her to make sure that she is drinking plenty of water to keep the bladder flushed.  Return in about 6 months (around 07/07/2023) for Mood.    Nani Gasser, MD

## 2023-01-06 NOTE — Assessment & Plan Note (Signed)
Could be that the GERD is just not well-controlled we did discuss stopping any type of acid medication and then checking for H. pylori in about a week.  She would need to avoid any food gum drinks etc. for about an hour before she comes in for testing.  Also consider it could be a medication side effect.  So that something for [place] to consider we did add Topamax earlier this summer and one of the side effects is nausea and decreased appetite.  Her weight is stable so that is good.

## 2023-01-06 NOTE — Assessment & Plan Note (Signed)
Doing well with current regimen overall continue sertraline 100 mg daily.

## 2023-01-13 ENCOUNTER — Ambulatory Visit: Payer: Commercial Managed Care - PPO | Admitting: Family Medicine

## 2023-01-13 ENCOUNTER — Ambulatory Visit: Payer: Commercial Managed Care - PPO | Admitting: Professional

## 2023-01-13 DIAGNOSIS — R300 Dysuria: Secondary | ICD-10-CM | POA: Diagnosis not present

## 2023-01-13 DIAGNOSIS — K2100 Gastro-esophageal reflux disease with esophagitis, without bleeding: Secondary | ICD-10-CM | POA: Diagnosis not present

## 2023-01-14 LAB — URINALYSIS, ROUTINE W REFLEX MICROSCOPIC
Bilirubin, UA: NEGATIVE
Glucose, UA: NEGATIVE
Ketones, UA: NEGATIVE
Leukocytes,UA: NEGATIVE
Nitrite, UA: NEGATIVE
Protein,UA: NEGATIVE
RBC, UA: NEGATIVE
Specific Gravity, UA: 1.02 (ref 1.005–1.030)
Urobilinogen, Ur: 0.2 mg/dL (ref 0.2–1.0)
pH, UA: 7 (ref 5.0–7.5)

## 2023-01-14 LAB — H. PYLORI BREATH TEST: H pylori Breath Test: NEGATIVE

## 2023-01-18 NOTE — Progress Notes (Signed)
Hi Virtie, the breath test was negative for the bacteria called H. pylori.  So at least we ruled that out as a cause for some of your reflux issues.  Urine sample this time is normal.  In looking at your medication list, Topamax could be causing a little bit of nausea.  Do you think it could be related?  Do think it started around that time?

## 2023-01-18 NOTE — Progress Notes (Signed)
Okay, then I would definitely recommend we refer her to GI for further workup.  See if she would be okay with that.  I think she has called insurance so we can definitely try to stay in network.

## 2023-02-10 ENCOUNTER — Ambulatory Visit: Payer: Commercial Managed Care - PPO | Admitting: Professional

## 2023-02-24 ENCOUNTER — Encounter: Payer: Self-pay | Admitting: Professional

## 2023-02-24 ENCOUNTER — Ambulatory Visit (INDEPENDENT_AMBULATORY_CARE_PROVIDER_SITE_OTHER): Payer: Commercial Managed Care - PPO | Admitting: Professional

## 2023-02-24 DIAGNOSIS — F840 Autistic disorder: Secondary | ICD-10-CM | POA: Diagnosis not present

## 2023-02-24 DIAGNOSIS — F411 Generalized anxiety disorder: Secondary | ICD-10-CM | POA: Diagnosis not present

## 2023-02-24 DIAGNOSIS — F418 Other specified anxiety disorders: Secondary | ICD-10-CM

## 2023-02-24 DIAGNOSIS — F900 Attention-deficit hyperactivity disorder, predominantly inattentive type: Secondary | ICD-10-CM | POA: Diagnosis not present

## 2023-02-24 NOTE — Progress Notes (Signed)
[facility] Counselor/Therapist Progress Note  Patient ID: Brenda Mendoza, MRN: [id number],    Date: 02/28/2023  Time Spent: 46 minutes 105-151pm   Treatment Type: Individual Therapy  Risk Assessment: Danger to Self:  No Self-injurious Behavior: No Danger to Others: No  Subjective: The patient arrived on time for her in-person session.   Issues addressed: 1-vacation a-just returned from a 7 day cruise to [place] and [place] with her boyfriend's family -the patient thinks this is her forever person -his parents referred to her fondly and commented that she was their someday DIL 2-loss of friendships -[employer] dance [employer] -pt stepped back for school -pt was unable to be available at their request -pt's bff at the time Destiny betryaed her -pt admits that she was very hurt when she was talked harshly to and cut off from her friend group -she admits that Destiny did not like her having more attention so she successfully sabotaged her -educated pt on how to discern sage vs unsafe people -pt admits that she learned to take her time and watch people  Treatment Plan Problems Addressed  Anxiety, Attention Deficit Disorder (ADD) - Adult, Eating Disorders And Obesity, Low Self-Esteem, Unipolar Depression  Goals 1. Achieve a satisfactory level of balance, structure, and intimacy in personal life. 2. Alleviate depressive symptoms and return to previous level of effective functioning. Objective Learn and implement problem-solving and decision-making skills. Target Date: 2023-11-18 Frequency: Biweekly  Progress: 0 Modality: individual  Objective Verbalize insight into how past relationships may be influencing current experiences with depression. Target Date: 2023-11-18 Frequency: Biweekly  Progress: 0 Modality: individual  Related Interventions Explore experiences from the client's childhood that contribute to current depressed state. Encourage the client to share  feelings of anger regarding pain inflicted on him/her in childhood that contributed to current depressed state. Explain a connection between previously unexpressed (repressed) feelings of anger (and helplessness) and current state of depression. Objective Learn and implement conflict resolution skills to resolve interpersonal problems. Target Date: 2023-11-18 Frequency: Biweekly  Progress: 0 Modality: individual  Related Interventions Teach conflict resolution skills (e.g., empathy, active listening, "I messages," respectful communication, assertiveness without aggression, compromise); use psychoeducation, modeling, role-playing, and rehearsal to work through several current conflicts; assign homework exercises; review and repeat so as to integrate their use into the client's life. Help the client resolve depression related to interpersonal problems through the use of reassurance and support, clarification of cognitive and affective triggers that ignite conflicts, and active problem-solving (or assign "Applying Problem-Solving to Interpersonal Conflict" in the Adult Psychotherapy Homework Planner by Stephannie Li). Objective Verbalize an understanding and resolution of current interpersonal problems. Target Date: 2023-11-18 Frequency: Biweekly  Progress: 0 Modality: individual  Related Interventions For interpersonal disputes, help the client explore the relationship, the nature of the dispute, whether it has reached an impasse, and available options to resolve it including learning and implementing conflict-resolution skills; if the relationship has reached an impasse, consider ways to change the impasse or to end the relationship. For interpersonal deficits, help the client develop new interpersonal skills and relationships. Objective Learn and implement behavioral strategies to overcome depression. Target Date: 2023-11-18 Frequency: Biweekly  Progress: 0 Modality: individual  Related  Interventions Engage the client in "behavioral activation," increasing his/her activity level and contact with sources of reward, while identifying processes that inhibit activation (see Behavioral Activation for Depression by Katharine Look, Dimidjian, and Herman-Dunn; or assign "Identify and Schedule Pleasant Activities" in the Adult Psychotherapy Homework Planner by [facility]); use behavioral techniques such  as instruction, rehearsal, role-playing, role reversal, as needed, to facilitate activity in the client's daily life; reinforce success. Assist the client in developing skills that increase the likelihood of deriving pleasure from behavioral activation (e.g., assertiveness skills, developing an exercise plan, less internal/more external focus, increased social involvement); reinforce success. 3. Develop a consistent, positive self-image. 4. Develop coping strategies (e.g., feeling identification, problem-solving, assertiveness) to address emotional issues that could lead to relapse of the eating disorder. 5. Develop healthy cognitive patterns and beliefs about self that lead to positive identity and prevent a relapse of the eating disorder. 6. Develop healthy interpersonal relationships that lead to alleviation and help prevent the relapse of the eating disorder. Objective Identify, challenge, and replace self-talk and beliefs that promote the anorexia or bulimia. Target Date: 2023-11-18 Frequency: Biweekly  Progress: 0 Modality: individual  Objective Learn and implement skills for managing urges to engage in unhealthy eating or weight loss practices. Target Date: 2023-11-18 Frequency: Biweekly  Progress: 0 Modality: individual  Related Interventions Teach the client tailored skills to manage high-risk situations including distraction, positive self-talk, problem-solving, conflict resolution (e.g., empathy, active listening, "I messages," respectful communication, assertiveness without aggression,  compromise), or other social/ communication skills; use modeling, role-playing, and behavior rehearsal to work through several current situations. Objective State a basis for positive identity that is not based on weight and appearance but on character, traits, relationships, and intrinsic value. Target Date: 2023-11-18 Frequency: Biweekly  Progress: 0 Modality: individual  Related Interventions Assist the client in identifying a basis for self-worth apart from body image by reviewing his/her talents, successes, positive traits, importance to others, and intrinsic spiritual value. Objective Verbalize an understanding of relapse prevention and the distinction between a lapse and a relapse. Target Date: 2023-11-18 Frequency: Biweekly  Progress: 0 Modality: individual  Related Interventions Discuss with the client the distinction between a lapse and relapse, associating a lapse with an initial and reversible return of distress, urges, or to avoid, and relapse with the decision to return to the cycle of maladaptive thoughts and actions (e.g., feeling anxious, binging, then purging). Identify with the client future situations or circumstances in which lapses could occur. 7. Develop healthy interpersonal relationships that lead to the alleviation and help prevent the relapse of depression. 8. Develop healthy thinking patterns and beliefs about self, others, and the world that lead to the alleviation and help prevent the relapse of depression. 9. Elevate self-esteem. Objective Identify and replace negative self-talk messages used to reinforce low self-esteem. Target Date: 2023-11-18 Frequency: Biweekly  Progress: 0 Modality: individual  Related Interventions Help the client identify his/her distorted, negative beliefs about self and the world and replace these messages with more realistic, affirmative messages (or assign "Journal and Replace Self-Defeating Thoughts" in the Adult Psychotherapy Homework  Planner by [facility] or read What to Say When You Talk to Yourself by [employer]). Ask the client to complete and process self-esteem-building exercises from recommended self-help books (e.g., Ten Days to Self Esteem! by Lawerance Bach; The Self-Esteem Companion by Murvin Donning, [place], and [employer]; 10 Simple Solutions for [employer] by [employer]). Objective Identify positive traits and talents about self. Target Date: 2023-11-18 Frequency: Biweekly  Progress: 0 Modality: individual  Related Interventions Assign the client the exercise of identifying his/her positive physical characteristics in a mirror to help him/her become more comfortable with himself/herself. Ask the client to keep building a list of positive traits and have him/her read the list at the beginning and end of each session (or assign "Acknowledging  My Strengths" or "What Are My Good Qualities?" in the Adult Psychotherapy Homework Planner by [facility]); reinforce the client's positive self-descriptive statements. Objective Positively acknowledge verbal compliments from others. Target Date: 2023-11-18 Frequency: Biweekly  Progress: 0 Modality: individual  Related Interventions Assign the client to be aware of and acknowledge graciously (without discounting) praise and compliments from others. Objective Demonstrate an increased ability to identify and express personal feelings. Target Date: 2023-11-18 Frequency: Biweekly  Progress: 0 Modality: individual  Related Interventions Assist the client in identifying and labeling emotions. Assign the client to keep a journal of feelings on a daily basis. Objective Decrease the verbalized fear of rejection while increasing statements of self-acceptance. Target Date: 2023-11-18 Frequency: Biweekly  Progress: 0 Modality: individual  Related Interventions Ask the client to make one positive statement about himself/herself daily and record it on a chart or in a journal) or assign  "Replacing Fears with Positive Messages" in the Adult Psychotherapy Homework Planner by Stephannie Li). 10. Enhance ability to effectively cope with the full variety of life's worries and anxieties. 11. Establish an inward sense of self-worth, confidence, and competence. 12. Learn and implement coping skills that result in a reduction of anxiety and worry, and improved daily functioning. 13. Minimize ADD behavioral interference in daily life. Objective Identify the current specific ADD behaviors that cause the most difficulty. Target Date: 2023-11-18 Frequency: Biweekly  Progress: 0 Modality: individual  Related Interventions Assist the client in identifying the current specific behaviors that cause him/her the most difficulty functioning as part of identifying treatment targets (i.e., a functional analysis). Objective Increase knowledge of ADHD and its treatment. Target Date: 2023-11-18 Frequency: Biweekly  Progress: 0 Modality: individual  Related Interventions Educate the client about the signs and symptoms of ADHD and how they disrupt functioning through the influence of distractibility, poor planning and organization, maladaptive thinking, frustration, impulsivity, and possible procrastination. Objective Identify, challenge, and change self-talk that contributes to maladaptive feelings and actions. Target Date: 2023-11-18 Frequency: Biweekly  Progress: 0 Modality: individual  Related Interventions Use cognitive therapy techniques to help client identify maladaptive self-talk (e.g., "I must do this perfectly," "I can do this later," "I can't organize all these things"); challenge biases, and generate alternatives. Assign homework asking client to implement cognitive restructuring skills while doing tasks in which maladaptive thinking has occurred previously; review and provide corrective feedback toward improving the skills. Objective List coping skills that will be used to manage ADD  symptoms. Target Date: 2023-11-18 Frequency: Biweekly  Progress: 0 Modality: individual  Related Interventions Review with the client the symptoms that have been problematic and the newly learned coping skills he/she will use to manage the symptoms (or assign "Symptoms and Fixes for ADD" in the Adult Psychotherapy Homework Planner by Stephannie Li). Objective Report improved listening skills without defensiveness. Target Date: 2023-11-18 Frequency: Biweekly  Progress: 0 Modality: individual  Related Interventions Use role-playing and modeling to teach the client how to listen and accept feedback from others regarding his/her behavior. 14. Recognize, accept, and cope with feelings of depression. 15. Reduce overall frequency, intensity, and duration of the anxiety so that daily functioning is not impaired. Objective Learn and implement calming skills to reduce overall anxiety and manage anxiety symptoms. Target Date: 2023-11-18 Frequency: Biweekly  Progress: 0 Modality: individual  Objective Verbalize an understanding of the cognitive, physiological, and behavioral components of anxiety and its treatment. Target Date: 2023-11-18 Frequency: Biweekly  Progress: 0 Modality: individual  Objective Learn and implement problem-solving strategies for realistically addressing worries. Target Date:  2023-11-18 Frequency: Biweekly  Progress: 0 Modality: individual  Objective Learn to accept limitations in life and commit to tolerating, rather than avoiding, unpleasant emotions while accomplishing meaningful goals. Target Date: 2023-11-18 Frequency: Biweekly  Progress: 0 Modality: individual  Objective Identify and engage in pleasant activities on a daily basis. Target Date: 2023-11-18 Frequency: Biweekly  Progress: 0 Modality: individual  Objective Verbalize an understanding of the role that cognitive biases play in excessive irrational worry and persistent anxiety symptoms. Target Date: 2023-11-18  Frequency: Biweekly  Progress: 0 Modality: individual  Objective Describe situations, thoughts, feelings, and actions associated with anxieties and worries, their impact on functioning, and attempts to resolve them. Target Date: 2023-11-18 Frequency: Biweekly  Progress: 0 Modality: individual  16. Restore normal eating patterns, healthy weight maintenance, and a realistic appraisal of body size.  Diagnosis: Generalized anxiety disorder  Attention Deficit Hyperactivity Disorder  Autism Spectrum Disorder  Plan:  -meet again on Thursday, March 10, 2023 at 1pm.

## 2023-03-08 ENCOUNTER — Encounter: Payer: Self-pay | Admitting: Family Medicine

## 2023-03-08 ENCOUNTER — Other Ambulatory Visit: Payer: Self-pay

## 2023-03-08 ENCOUNTER — Ambulatory Visit: Payer: Commercial Managed Care - PPO | Admitting: Family Medicine

## 2023-03-08 ENCOUNTER — Other Ambulatory Visit (HOSPITAL_BASED_OUTPATIENT_CLINIC_OR_DEPARTMENT_OTHER): Payer: Self-pay

## 2023-03-08 VITALS — BP 105/69 | HR 69 | Ht 61.0 in | Wt 111.0 lb

## 2023-03-08 DIAGNOSIS — R051 Acute cough: Secondary | ICD-10-CM

## 2023-03-08 DIAGNOSIS — Z124 Encounter for screening for malignant neoplasm of cervix: Secondary | ICD-10-CM | POA: Diagnosis not present

## 2023-03-08 DIAGNOSIS — G43109 Migraine with aura, not intractable, without status migrainosus: Secondary | ICD-10-CM | POA: Diagnosis not present

## 2023-03-08 DIAGNOSIS — G43009 Migraine without aura, not intractable, without status migrainosus: Secondary | ICD-10-CM | POA: Diagnosis not present

## 2023-03-08 DIAGNOSIS — F418 Other specified anxiety disorders: Secondary | ICD-10-CM

## 2023-03-08 MED ORDER — LANSOPRAZOLE 30 MG PO CPDR
30.0000 mg | DELAYED_RELEASE_CAPSULE | Freq: Every morning | ORAL | 0 refills | Status: DC
  Filled 2023-03-08: qty 90, 90d supply, fill #0

## 2023-03-08 MED ORDER — HYDROXYZINE HCL 25 MG PO TABS
25.0000 mg | ORAL_TABLET | Freq: Three times a day (TID) | ORAL | 0 refills | Status: AC | PRN
Start: 2023-03-08 — End: ?
  Filled 2023-03-08: qty 30, 5d supply, fill #0

## 2023-03-08 MED ORDER — SERTRALINE HCL 100 MG PO TABS
100.0000 mg | ORAL_TABLET | Freq: Every day | ORAL | 1 refills | Status: DC
  Filled 2023-03-08: qty 90, 90d supply, fill #0
  Filled 2023-08-21: qty 90, 90d supply, fill #1

## 2023-03-08 MED ORDER — TOPIRAMATE 25 MG PO TABS
25.0000 mg | ORAL_TABLET | Freq: Every day | ORAL | 1 refills | Status: DC
  Filled 2023-03-08: qty 90, 90d supply, fill #0
  Filled 2023-08-21: qty 90, 90d supply, fill #1

## 2023-03-08 MED ORDER — RIZATRIPTAN BENZOATE 5 MG PO TBDP
5.0000 mg | ORAL_TABLET | ORAL | 3 refills | Status: AC | PRN
  Filled 2023-03-08: qty 30, 90d supply, fill #0

## 2023-03-08 NOTE — Assessment & Plan Note (Signed)
New current regimen.  Plan to follow back up in 6 months call if any concerns or problems.

## 2023-03-08 NOTE — Progress Notes (Signed)
Established Patient Office Visit  Subjective   Patient ID: Brenda Mendoza, female    DOB: 05/26/2000  Age: 22 y.o. MRN: [id number]  Chief Complaint  Patient presents with   Follow-up         HPI F/U depression/anxiety -overall she is doing fairly well she actually just got back from a cruise that the swelling she picked up a viral upper respiratory infection.  She is feeling better but she does have a little bit of a residual cough she will have a little coughing get a few times a day with a little bit of mucus.  She never developed any fever or chills that she knows of.  PHQ-9 score of 11 and GAD-7 score of 7.  Stomach symptoms have actually improved greatly she is no longer on the lansoprazole.  She thinks a lot of it may have been stress triggered.  Now that she has quit school and she is just working and that is been a lot better.     ROS    Objective:     BP 105/69   Pulse 69   Ht 5\' 1"  (1.549 m)   Wt 111 lb (50.3 kg)   SpO2 99%   BMI 20.97 kg/m    Physical Exam Vitals and nursing note reviewed.  Constitutional:      Appearance: Normal appearance.  HENT:     Head: Normocephalic and atraumatic.  Eyes:     Conjunctiva/sclera: Conjunctivae normal.  Cardiovascular:     Rate and Rhythm: Normal rate and regular rhythm.  Pulmonary:     Effort: Pulmonary effort is normal.     Breath sounds: Normal breath sounds.  Skin:    General: Skin is warm and dry.  Neurological:     Mental Status: She is alert.  Psychiatric:        Mood and Affect: Mood normal.      No results found for any visits on 03/08/23.    The ASCVD Risk score (Arnett DK, et al., 2019) failed to calculate for the following reasons:   The 2019 ASCVD risk score is only valid for ages 13 to 28    Assessment & Plan:   Problem List Items Addressed This Visit       Cardiovascular and Mediastinum   Migraine without aura    Controlled. on current regimen.  Currently on Topamax and  Maxalt.      Relevant Medications   rizatriptan (MAXALT-MLT) 5 MG disintegrating tablet   sertraline (ZOLOFT) 100 MG tablet   topiramate (TOPAMAX) 25 MG tablet     Other   Depression with anxiety - Primary    New current regimen.  Plan to follow back up in 6 months call if any concerns or problems.      Relevant Medications   hydrOXYzine (ATARAX) 25 MG tablet   sertraline (ZOLOFT) 100 MG tablet   Other Visit Diagnoses     Migraine with aura and without status migrainosus, not intractable  (Chronic)      Relevant Medications   rizatriptan (MAXALT-MLT) 5 MG disintegrating tablet   sertraline (ZOLOFT) 100 MG tablet   topiramate (TOPAMAX) 25 MG tablet   Acute cough       Cervical cancer screening       Relevant Orders   Ambulatory referral to Obstetrics / Gynecology       Cough most consistent with postviral cough should continue to improve on its own over the next week or 2 call  if not improving.  Like referral to OB/GYN for Pap smear.  Return in about 6 months (around 09/06/2023) for Mood.    Nani Gasser, MD

## 2023-03-08 NOTE — Assessment & Plan Note (Addendum)
Controlled. on current regimen.  Currently on Topamax and Maxalt.

## 2023-03-10 ENCOUNTER — Ambulatory Visit (INDEPENDENT_AMBULATORY_CARE_PROVIDER_SITE_OTHER): Payer: Commercial Managed Care - PPO | Admitting: Professional

## 2023-03-10 ENCOUNTER — Encounter: Payer: Self-pay | Admitting: Professional

## 2023-03-10 DIAGNOSIS — F900 Attention-deficit hyperactivity disorder, predominantly inattentive type: Secondary | ICD-10-CM | POA: Diagnosis not present

## 2023-03-10 DIAGNOSIS — F840 Autistic disorder: Secondary | ICD-10-CM | POA: Diagnosis not present

## 2023-03-10 DIAGNOSIS — F411 Generalized anxiety disorder: Secondary | ICD-10-CM | POA: Diagnosis not present

## 2023-03-10 NOTE — Progress Notes (Signed)
[facility] Counselor/Therapist Progress Note  Patient ID: Brenda Mendoza, MRN: [id number],    Date: 02/24/2023  Time Spent: 46 minutes 105-151pm   Treatment Type: Individual Therapy  Risk Assessment: Danger to Self:  No Self-injurious Behavior: No Danger to Others: No  Subjective: The patient arrived on time for her in-person session.   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 Achieve a satisfactory level of balance, structure, and intimacy in personal life. 2. Alleviate depressive symptoms and return to previous level of effective functioning. Objective Learn and implement problem-solving and decision-making skills. Target Date: 2023-11-18 Frequency: Biweekly  Progress: 0 Modality: individual  Objective Verbalize insight into how past relationships may be influencing current experiences with depression. Target Date: 2023-11-18 Frequency: Biweekly  Progress: 0 Modality: individual  Related Interventions Explore experiences from the client's childhood that contribute to current depressed state. Encourage the client to share feelings of anger regarding pain inflicted on him/her in childhood that contributed to current depressed state. Explain a connection between previously unexpressed (repressed)  feelings of anger (and helplessness) and current state of depression. Objective Learn and implement conflict resolution skills to resolve interpersonal problems. Target Date: 2023-11-18 Frequency: Biweekly  Progress: 0 Modality: individual  Related Interventions Teach conflict resolution skills (e.g., empathy, active listening, "I messages," respectful communication, assertiveness without aggression, compromise); use psychoeducation, modeling, role-playing, and rehearsal to work through several current conflicts; assign homework exercises; review and repeat so as to integrate their use into the client's life. Help the client resolve depression related to interpersonal problems through the use of reassurance and support, clarification of cognitive and affective triggers that ignite conflicts, and active problem-solving (or assign "Applying Problem-Solving to Interpersonal Conflict" in the Adult Psychotherapy Homework Planner by Stephannie Li). Objective Verbalize an understanding and resolution of current interpersonal problems. Target Date: 2023-11-18 Frequency: Biweekly  Progress: 0 Modality: individual  Related Interventions For interpersonal disputes, help the client explore the relationship, the nature of the dispute, whether it has reached an impasse, and available options to resolve it including learning and implementing conflict-resolution skills; if the relationship has reached an impasse, consider ways to change the impasse or to end the relationship. For interpersonal deficits, help the client develop new interpersonal skills and relationships. Objective Learn and implement behavioral strategies to overcome depression. Target Date: 2023-11-18 Frequency: Biweekly  Progress: 0 Modality: individual  Related Interventions Engage the client in "behavioral activation," increasing his/her activity level and contact with sources of reward, while identifying processes that inhibit activation (see  Behavioral Activation for Depression by Katharine Look, Dimidjian, and Herman-Dunn; or assign "Identify and Schedule Pleasant Activities" in the Adult Psychotherapy Homework Planner by [facility]); use behavioral techniques such as instruction, rehearsal, role-playing, role reversal, as needed, to facilitate activity in the client's daily life; reinforce success. Assist the client in developing skills that increase the likelihood of deriving  pleasure from behavioral activation (e.g., assertiveness skills, developing an exercise plan, less internal/more external focus, increased social involvement); reinforce success. 3. Develop a consistent, positive self-image. 4. Develop coping strategies (e.g., feeling identification, problem-solving, assertiveness) to address emotional issues that could lead to relapse of the eating disorder. 5. Develop healthy cognitive patterns and beliefs about self that lead to positive identity and prevent a relapse of the eating disorder. 6. Develop healthy interpersonal relationships that lead to alleviation and help prevent the relapse of the eating disorder. Objective Identify, challenge, and replace self-talk and beliefs that promote the anorexia or bulimia. Target Date: 2023-11-18 Frequency: Biweekly  Progress: 0 Modality: individual  Objective Learn and implement skills for managing urges to engage in unhealthy eating or weight loss practices. Target Date: 2023-11-18 Frequency: Biweekly  Progress: 0 Modality: individual  Related Interventions Teach the client tailored skills to manage high-risk situations including distraction, positive self-talk, problem-solving, conflict resolution (e.g., empathy, active listening, "I messages," respectful communication, assertiveness without aggression, compromise), or other social/ communication skills; use modeling, role-playing, and behavior rehearsal to work through several current situations. Objective State a basis for positive  identity that is not based on weight and appearance but on character, traits, relationships, and intrinsic value. Target Date: 2023-11-18 Frequency: Biweekly  Progress: 0 Modality: individual  Related Interventions Assist the client in identifying a basis for self-worth apart from body image by reviewing his/her talents, successes, positive traits, importance to others, and intrinsic spiritual value. Objective Verbalize an understanding of relapse prevention and the distinction between a lapse and a relapse. Target Date: 2023-11-18 Frequency: Biweekly  Progress: 0 Modality: individual  Related Interventions Discuss with the client the distinction between a lapse and relapse, associating a lapse with an initial and reversible return of distress, urges, or to avoid, and relapse with the decision to return to the cycle of maladaptive thoughts and actions (e.g., feeling anxious, binging, then purging). Identify with the client future situations or circumstances in which lapses could occur. 7. Develop healthy interpersonal relationships that lead to the alleviation and help prevent the relapse of depression. 8. Develop healthy thinking patterns and beliefs about self, others, and the world that lead to the alleviation and help prevent the relapse of depression. 9. Elevate self-esteem. Objective Identify and replace negative self-talk messages used to reinforce low self-esteem. Target Date: 2023-11-18 Frequency: Biweekly  Progress: 0 Modality: individual  Related Interventions Help the client identify his/her distorted, negative beliefs about self and the world and replace these messages with more realistic, affirmative messages (or assign "Journal and Replace Self-Defeating Thoughts" in the Adult Psychotherapy Homework Planner by [facility] or read What to Say When You Talk to Yourself by [employer]). Ask the client to complete and process self-esteem-building exercises from recommended self-help books  (e.g., Ten Days to Self Esteem! by Lawerance Bach; The Self-Esteem Companion by Murvin Donning, [place], and [employer]; 10 Simple Solutions for [employer] by [employer]). Objective Identify positive traits and talents about self. Target Date: 2023-11-18 Frequency: Biweekly  Progress: 0 Modality: individual  Related Interventions Assign the client the exercise of identifying his/her positive physical characteristics in a mirror to help him/her become more comfortable with himself/herself. Ask the client to keep building a list of positive traits and have him/her read the list at the beginning and end of each session (or assign "Acknowledging My Strengths" or "What Are My Good Qualities?" in the Adult Psychotherapy Homework Planner by [facility]); reinforce the client's positive self-descriptive statements. Objective Positively acknowledge verbal compliments from others. Target  Date: 2023-11-18 Frequency: Biweekly  Progress: 0 Modality: individual  Related Interventions Assign the client to be aware of and acknowledge graciously (without discounting) praise and compliments from others. Objective Demonstrate an increased ability to identify and express personal feelings. Target Date: 2023-11-18 Frequency: Biweekly  Progress: 0 Modality: individual  Related Interventions Assist the client in identifying and labeling emotions. Assign the client to keep a journal of feelings on a daily basis. Objective Decrease the verbalized fear of rejection while increasing statements of self-acceptance. Target Date: 2023-11-18 Frequency: Biweekly  Progress: 0 Modality: individual  Related Interventions Ask the client to make one positive statement about himself/herself daily and record it on a chart or in a journal) or assign "Replacing Fears with Positive Messages" in the Adult Psychotherapy Homework Planner by Stephannie Li). 10. Enhance ability to effectively cope with the full variety of life's worries and  anxieties. 11. Establish an inward sense of self-worth, confidence, and competence. 12. Learn and implement coping skills that result in a reduction of anxiety and worry, and improved daily functioning. 13. Minimize ADD behavioral interference in daily life. Objective Identify the current specific ADD behaviors that cause the most difficulty. Target Date: 2023-11-18 Frequency: Biweekly  Progress: 0 Modality: individual  Related Interventions Assist the client in identifying the current specific behaviors that cause him/her the most difficulty functioning as part of identifying treatment targets (i.e., a functional analysis). Objective Increase knowledge of ADHD and its treatment. Target Date: 2023-11-18 Frequency: Biweekly  Progress: 0 Modality: individual  Related Interventions Educate the client about the signs and symptoms of ADHD and how they disrupt functioning through the influence of distractibility, poor planning and organization, maladaptive thinking, frustration, impulsivity, and possible procrastination. Objective Identify, challenge, and change self-talk that contributes to maladaptive feelings and actions. Target Date: 2023-11-18 Frequency: Biweekly  Progress: 0 Modality: individual  Related Interventions Use cognitive therapy techniques to help client identify maladaptive self-talk (e.g., "I must do this perfectly," "I can do this later," "I can't organize all these things"); challenge biases, and generate alternatives. Assign homework asking client to implement cognitive restructuring skills while doing tasks in which maladaptive thinking has occurred previously; review and provide corrective feedback toward improving the skills. Objective List coping skills that will be used to manage ADD symptoms. Target Date: 2023-11-18 Frequency: Biweekly  Progress: 0 Modality: individual  Related Interventions Review with the client the symptoms that have been problematic and the newly  learned coping skills he/she will use to manage the symptoms (or assign "Symptoms and Fixes for ADD" in the Adult Psychotherapy Homework Planner by Stephannie Li). Objective Report improved listening skills without defensiveness. Target Date: 2023-11-18 Frequency: Biweekly  Progress: 0 Modality: individual  Related Interventions Use role-playing and modeling to teach the client how to listen and accept feedback from others regarding his/her behavior. 14. Recognize, accept, and cope with feelings of depression. 15. Reduce overall frequency, intensity, and duration of the anxiety so that daily functioning is not impaired. Objective Learn and implement calming skills to reduce overall anxiety and manage anxiety symptoms. Target Date: 2023-11-18 Frequency: Biweekly  Progress: 0 Modality: individual  Objective Verbalize an understanding of the cognitive, physiological, and behavioral components of anxiety and its treatment. Target Date: 2023-11-18 Frequency: Biweekly  Progress: 0 Modality: individual  Objective Learn and implement problem-solving strategies for realistically addressing worries. Target Date: 2023-11-18 Frequency: Biweekly  Progress: 0 Modality: individual  Objective Learn to accept limitations in life and commit to tolerating, rather than avoiding, unpleasant emotions while accomplishing meaningful goals. Target  Date: 2023-11-18 Frequency: Biweekly  Progress: 0 Modality: individual  Objective Identify and engage in pleasant activities on a daily basis. Target Date: 2023-11-18 Frequency: Biweekly  Progress: 0 Modality: individual  Objective Verbalize an understanding of the role that cognitive biases play in excessive irrational worry and persistent anxiety symptoms. Target Date: 2023-11-18 Frequency: Biweekly  Progress: 0 Modality: individual  Objective Describe situations, thoughts, feelings, and actions associated with anxieties and worries, their impact on functioning, and  attempts to resolve them. Target Date: 2023-11-18 Frequency: Biweekly  Progress: 0 Modality: individual  16. Restore normal eating patterns, healthy weight maintenance, and a realistic appraisal of body size.  Diagnosis: Generalized anxiety disorder  Attention Deficit Hyperactivity Disorder  Autism Spectrum Disorder  Plan:  -meet again on Thursday, March 24, 2023 at 1pm.

## 2023-03-24 ENCOUNTER — Ambulatory Visit: Payer: Commercial Managed Care - PPO | Admitting: Professional

## 2023-03-31 ENCOUNTER — Encounter: Payer: Commercial Managed Care - PPO | Admitting: Obstetrics and Gynecology

## 2023-04-17 NOTE — Progress Notes (Signed)
ANNUAL EXAM Patient name: Brenda Mendoza MRN [id number]  Date of birth: 31-Dec-2000 Chief Complaint:   Annual Exam  History of Present Illness:   Brenda Mendoza is a 23 y.o. G0P0000 female being seen today for a routine annual exam.   Current concerns: Sometimes has cramping type pain AFTER intercourse. Doesn't happen all the time. Also sometimes has UTI type symptoms but they aren't pain symptoms - she feels like she still has to void after she has already gone. She drinks water and that helps. She has a discharge that sometimes is thicker, but doesn't irritate her (although notes yeast infection 1-2 times in the past). She occasional has odor but not persistent.   She has a fear of pregnancy and childbirth. She is not opposed to pregnancy but has no plans for it at this time.   She reports she has never had an orgasm (identified as asexual when she was younger). It is not currently bothersome to her.   Current birth control: Xulane patch. Uses it for contraceptive purposes.  Has migraines but no aura.   Patient's last menstrual period was 03/31/2023.   Last Pap/Pap History:  No prior paps   Health Maintenance Due  Topic Date Due   COVID-19 Vaccine (4 - 2024-25 season) 12/05/2022    Review of Systems:   Pertinent items are noted in HPI Denies any headaches, blurred vision, fatigue, shortness of breath, chest pain, abdominal pain, abnormal vaginal discharge/itching/odor/irritation, problems with periods, bowel movements, urination, unless otherwise stated above.  Pertinent History Reviewed:  Reviewed past medical,surgical, social and family history.  Reviewed problem list, medications and allergies. Physical Assessment:   Vitals:   04/21/23 1327  BP: (!) 94/54  Pulse: 68  Weight: 110 lb (49.9 kg)  Height: 5\' 1"  (1.549 m)  Body mass index is 20.78 kg/m.   Physical Examination:  General appearance - well appearing, and in no distress Mental status - alert, oriented  to person, place, and time Psych:  She has a normal mood and affect Skin - warm and dry, normal color, no suspicious lesions noted Chest - effort normal Heart - normal rate  Breasts - breasts appear normal, no suspicious masses, no skin or nipple changes or axillary nodes Abdomen - soft, nontender, nondistended, no masses or organomegaly Pelvic -  VULVA: normal appearing vulva with no masses, tenderness or lesions  VAGINA: normal appearing vagina with normal color and discharge, no lesions  CERVIX: normal appearing cervix without discharge or lesions, no CMT UTERUS: uterus is felt to be normal size, shape, consistency and nontender  ADNEXA: No adnexal masses or tenderness noted. Extremities:  No swelling or varicosities noted  Chaperone present for exam  No results found for this or any previous visit (from the past 24 hours).  Assessment & Plan:  [name] was seen today for annual exam.  Diagnoses and all orders for this visit:  Encounter for annual routine gynecological examination -     Cytology - PAP( [facility])  - Cervical cancer screening: Discussed screening Q3 years. Reviewed importance of annual exams and limits of pap smear. Pap with reflex HPV  - GC/CT: Discussed and recommended. Pt  accepts - Gardasil: completed - Birth Control:  Patch - Breast Health: Encouraged self breast awareness/exams. Teaching provided. - Follow-up: 12 months and prn     No orders of the defined types were placed in this encounter.   Meds: No orders of the defined types were placed in this encounter.  Follow-up: Return in about 1 year (around 04/20/2024) for annual.  Milas Hock, MD 04/21/2023 1:50 PM

## 2023-04-21 ENCOUNTER — Ambulatory Visit (INDEPENDENT_AMBULATORY_CARE_PROVIDER_SITE_OTHER): Payer: Commercial Managed Care - PPO | Admitting: Obstetrics and Gynecology

## 2023-04-21 ENCOUNTER — Other Ambulatory Visit (HOSPITAL_COMMUNITY)
Admission: RE | Admit: 2023-04-21 | Discharge: 2023-04-21 | Disposition: A | Discharge status: Home / Self Care | Payer: Commercial Managed Care - PPO | Source: Ambulatory Visit | Attending: Obstetrics and Gynecology | Admitting: Obstetrics and Gynecology

## 2023-04-21 ENCOUNTER — Encounter: Payer: Self-pay | Admitting: Obstetrics and Gynecology

## 2023-04-21 VITALS — BP 94/54 | HR 68 | Ht 61.0 in | Wt 110.0 lb

## 2023-04-21 DIAGNOSIS — Z01419 Encounter for gynecological examination (general) (routine) without abnormal findings: Secondary | ICD-10-CM

## 2023-04-21 NOTE — Progress Notes (Signed)
Pt requests STI testing off of pap- declines bloodwork

## 2023-04-25 LAB — CYTOLOGY - PAP
Adequacy: ABSENT
Chlamydia: NEGATIVE
Comment: NEGATIVE
Comment: NEGATIVE
Comment: NORMAL
Diagnosis: NEGATIVE
Neisseria Gonorrhea: NEGATIVE
Trichomonas: NEGATIVE

## 2023-04-26 ENCOUNTER — Encounter: Payer: Self-pay | Admitting: Obstetrics and Gynecology

## 2023-05-17 DIAGNOSIS — R300 Dysuria: Secondary | ICD-10-CM | POA: Diagnosis not present

## 2023-05-18 ENCOUNTER — Ambulatory Visit: Payer: Commercial Managed Care - PPO | Admitting: Physician Assistant

## 2023-05-18 ENCOUNTER — Other Ambulatory Visit (HOSPITAL_BASED_OUTPATIENT_CLINIC_OR_DEPARTMENT_OTHER): Payer: Self-pay

## 2023-05-18 VITALS — BP 109/60 | HR 66 | Temp 98.3°F | Ht 61.0 in | Wt 112.0 lb

## 2023-05-18 DIAGNOSIS — R300 Dysuria: Secondary | ICD-10-CM | POA: Diagnosis not present

## 2023-05-18 DIAGNOSIS — G43009 Migraine without aura, not intractable, without status migrainosus: Secondary | ICD-10-CM

## 2023-05-18 DIAGNOSIS — N3001 Acute cystitis with hematuria: Secondary | ICD-10-CM

## 2023-05-18 LAB — POCT URINALYSIS DIP (CLINITEK)
Bilirubin, UA: NEGATIVE
Glucose, UA: NEGATIVE mg/dL
Ketones, POC UA: NEGATIVE mg/dL
Nitrite, UA: NEGATIVE
Spec Grav, UA: 1.02 (ref 1.010–1.025)
Urobilinogen, UA: 0.2 U/dL
pH, UA: 7 (ref 5.0–8.0)

## 2023-05-18 MED ORDER — ONDANSETRON 8 MG PO TBDP
8.0000 mg | ORAL_TABLET | Freq: Three times a day (TID) | ORAL | 1 refills | Status: DC | PRN
  Filled 2023-05-18: qty 20, 7d supply, fill #0

## 2023-05-18 MED ORDER — DEXAMETHASONE SODIUM PHOSPHATE 10 MG/ML IJ SOLN
10.0000 mg | Freq: Once | INTRAMUSCULAR | Status: AC
  Administered 2023-05-18: 10 mg via INTRAMUSCULAR

## 2023-05-18 MED ORDER — NITROFURANTOIN MONOHYD MACRO 100 MG PO CAPS
100.0000 mg | ORAL_CAPSULE | Freq: Two times a day (BID) | ORAL | 0 refills | Status: DC
  Filled 2023-05-18: qty 14, 7d supply, fill #0

## 2023-05-18 MED ORDER — KETOROLAC TROMETHAMINE 60 MG/2ML IM SOLN
60.0000 mg | Freq: Once | INTRAMUSCULAR | Status: AC
  Administered 2023-05-18: 60 mg via INTRAMUSCULAR

## 2023-05-18 NOTE — Patient Instructions (Signed)

## 2023-05-18 NOTE — Progress Notes (Signed)
Acute Office Visit  Subjective:     Patient ID: Brenda Mendoza, female    DOB: 07-26-2000, 23 y.o.   MRN: [id number]  Chief Complaint  Patient presents with   Dysuria    Dysuria  Associated symptoms include chills, flank pain, frequency, nausea and urgency. Pertinent negatives include no hematuria or vomiting.   Patient is a 23 yo female in today for multiple complaints. She feels like she has a UTI and has been having symptoms for the past week. She states her urine has a bad smell. She states she gets a UTI once every couple of months. She is sexually active and does not use protection. She states she is also dehydrated and is not good at drinking water.   She also endorses a migraine that started on Sunday evening with associated nausea. She describes it as a strong dull pain that is located on the frontal aspect of the forehead. She states she took Topiramate and ibuprofen and maxalt. Migraine has not resolved.   Review of Systems  Constitutional:  Positive for chills. Negative for fever.  HENT: Negative.    Eyes: Negative.   Respiratory: Negative.  Negative for shortness of breath.   Cardiovascular: Negative.   Gastrointestinal:  Positive for nausea. Negative for vomiting.  Genitourinary:  Positive for flank pain, frequency and urgency. Negative for hematuria.  Musculoskeletal:  Negative for back pain.  Skin: Negative.   Neurological:  Positive for headaches.  Psychiatric/Behavioral: Negative.        Objective:    BP 109/60   Pulse 66   Temp 98.3 F (36.8 C) (Oral)   Ht 5\' 1"  (1.549 m)   Wt 112 lb (50.8 kg)   LMP 03/31/2023   SpO2 99%   BMI 21.16 kg/m  BP Readings from Last 3 Encounters:  05/18/23 109/60  04/21/23 (!) 94/54  03/08/23 105/69   Wt Readings from Last 3 Encounters:  05/18/23 112 lb (50.8 kg)  04/21/23 110 lb (49.9 kg)  03/08/23 111 lb (50.3 kg)    Physical Exam Constitutional:      Appearance: She is normal weight.  HENT:     Head:  Normocephalic and atraumatic.  Cardiovascular:     Rate and Rhythm: Normal rate and regular rhythm.     Pulses: Normal pulses.     Heart sounds: Normal heart sounds.  Pulmonary:     Effort: Pulmonary effort is normal.     Breath sounds: Normal breath sounds.  Abdominal:     General: Abdomen is flat.     Palpations: Abdomen is soft.     Tenderness: There is abdominal tenderness.     Comments: Suprapubic tenderness   Neurological:     Mental Status: She is alert.  Psychiatric:        Mood and Affect: Mood normal.     Results for orders placed or performed in visit on 05/18/23  POCT URINALYSIS DIP (CLINITEK)   Collection Time: 05/18/23 11:31 AM  Result Value Ref Range   Color, UA yellow yellow   Clarity, UA clear clear   Glucose, UA negative negative mg/dL   Bilirubin, UA negative negative   Ketones, POC UA negative negative mg/dL   Spec Grav, UA 4.098 1.191 - 1.025   Blood, UA trace-intact (A) negative   pH, UA 7.0 5.0 - 8.0   POC PROTEIN,UA trace negative, trace   Urobilinogen, UA 0.2 0.2 or 1.0 E.U./dL   Nitrite, UA Negative Negative   Leukocytes, UA  Trace (A) Negative    Assessment & Plan:  Marland KitchenMarland KitchenMadilynn was seen today for dysuria.  Diagnoses and all orders for this visit:  Acute cystitis with hematuria -     nitrofurantoin, macrocrystal-monohydrate, (MACROBID) 100 MG capsule; Take 1 capsule (100 mg total) by mouth 2 (two) times daily.  Dysuria -     POCT URINALYSIS DIP (CLINITEK) -     Urine Culture  Migraine without aura and without status migrainosus, not intractable -     ondansetron (ZOFRAN-ODT) 8 MG disintegrating tablet; Take 1 tablet (8 mg total) by mouth every 8 (eight) hours as needed. -     ketorolac (TORADOL) injection 60 mg -     dexamethasone (DECADRON) injection 10 mg   Acute cystitis with hematuria Urinalysis shows trace leukocytes, trace blood, trace protein. Suprapubic tenderness on physical exam. Patient reports dysuria, urgency, back pain x1  week.  Start Macrobid 100mg  BID for 7 days Patient educated on hydration and that sexual intercourse can cause UTIs Discussed cranberry juice to prevent UTIs  Migraine Migraine cocktail given in clinic Zofran 8mg  disintigrating tablet given for nausea   Continue to use maxalt for rescue    Tandy Gaw, PA-C

## 2023-05-20 ENCOUNTER — Encounter: Payer: Self-pay | Admitting: Physician Assistant

## 2023-05-21 LAB — URINE CULTURE

## 2023-05-23 ENCOUNTER — Encounter: Payer: Self-pay | Admitting: Physician Assistant

## 2023-05-23 ENCOUNTER — Other Ambulatory Visit (HOSPITAL_BASED_OUTPATIENT_CLINIC_OR_DEPARTMENT_OTHER): Payer: Self-pay

## 2023-05-23 MED ORDER — NURTEC 75 MG PO TBDP
1.0000 | ORAL_TABLET | ORAL | 1 refills | Status: DC
  Filled 2023-05-23: qty 15, 30d supply, fill #0

## 2023-05-23 NOTE — Progress Notes (Signed)
 Confirmed E.coli and abx given should treat! Follow up if symptoms persist.

## 2023-05-24 ENCOUNTER — Other Ambulatory Visit (HOSPITAL_BASED_OUTPATIENT_CLINIC_OR_DEPARTMENT_OTHER): Payer: Self-pay

## 2023-05-25 ENCOUNTER — Other Ambulatory Visit (HOSPITAL_BASED_OUTPATIENT_CLINIC_OR_DEPARTMENT_OTHER): Payer: Self-pay

## 2023-05-26 ENCOUNTER — Other Ambulatory Visit (HOSPITAL_BASED_OUTPATIENT_CLINIC_OR_DEPARTMENT_OTHER): Payer: Self-pay

## 2023-05-27 ENCOUNTER — Other Ambulatory Visit (HOSPITAL_BASED_OUTPATIENT_CLINIC_OR_DEPARTMENT_OTHER): Payer: Self-pay

## 2023-05-30 ENCOUNTER — Other Ambulatory Visit (HOSPITAL_BASED_OUTPATIENT_CLINIC_OR_DEPARTMENT_OTHER): Payer: Self-pay

## 2023-05-31 ENCOUNTER — Other Ambulatory Visit (HOSPITAL_BASED_OUTPATIENT_CLINIC_OR_DEPARTMENT_OTHER): Payer: Self-pay

## 2023-06-02 ENCOUNTER — Other Ambulatory Visit (HOSPITAL_BASED_OUTPATIENT_CLINIC_OR_DEPARTMENT_OTHER): Payer: Self-pay

## 2023-06-07 ENCOUNTER — Other Ambulatory Visit (HOSPITAL_BASED_OUTPATIENT_CLINIC_OR_DEPARTMENT_OTHER): Payer: Self-pay

## 2023-06-08 ENCOUNTER — Other Ambulatory Visit (HOSPITAL_BASED_OUTPATIENT_CLINIC_OR_DEPARTMENT_OTHER): Payer: Self-pay

## 2023-06-09 ENCOUNTER — Other Ambulatory Visit (HOSPITAL_BASED_OUTPATIENT_CLINIC_OR_DEPARTMENT_OTHER): Payer: Self-pay

## 2023-06-10 ENCOUNTER — Other Ambulatory Visit (HOSPITAL_BASED_OUTPATIENT_CLINIC_OR_DEPARTMENT_OTHER): Payer: Self-pay

## 2023-06-13 ENCOUNTER — Other Ambulatory Visit (HOSPITAL_BASED_OUTPATIENT_CLINIC_OR_DEPARTMENT_OTHER): Payer: Self-pay

## 2023-06-15 ENCOUNTER — Other Ambulatory Visit (HOSPITAL_BASED_OUTPATIENT_CLINIC_OR_DEPARTMENT_OTHER): Payer: Self-pay

## 2023-06-16 ENCOUNTER — Other Ambulatory Visit (HOSPITAL_BASED_OUTPATIENT_CLINIC_OR_DEPARTMENT_OTHER): Payer: Self-pay

## 2023-06-17 ENCOUNTER — Other Ambulatory Visit (HOSPITAL_BASED_OUTPATIENT_CLINIC_OR_DEPARTMENT_OTHER): Payer: Self-pay

## 2023-06-20 ENCOUNTER — Other Ambulatory Visit (HOSPITAL_BASED_OUTPATIENT_CLINIC_OR_DEPARTMENT_OTHER): Payer: Self-pay

## 2023-06-21 ENCOUNTER — Other Ambulatory Visit (HOSPITAL_BASED_OUTPATIENT_CLINIC_OR_DEPARTMENT_OTHER): Payer: Self-pay

## 2023-06-22 ENCOUNTER — Other Ambulatory Visit (HOSPITAL_BASED_OUTPATIENT_CLINIC_OR_DEPARTMENT_OTHER): Payer: Self-pay

## 2023-06-23 ENCOUNTER — Other Ambulatory Visit (HOSPITAL_BASED_OUTPATIENT_CLINIC_OR_DEPARTMENT_OTHER): Payer: Self-pay

## 2023-06-27 ENCOUNTER — Other Ambulatory Visit (HOSPITAL_BASED_OUTPATIENT_CLINIC_OR_DEPARTMENT_OTHER): Payer: Self-pay

## 2023-06-28 ENCOUNTER — Other Ambulatory Visit (HOSPITAL_BASED_OUTPATIENT_CLINIC_OR_DEPARTMENT_OTHER): Payer: Self-pay

## 2023-07-07 ENCOUNTER — Encounter: Payer: Self-pay | Admitting: Family Medicine

## 2023-07-07 ENCOUNTER — Ambulatory Visit: Payer: Commercial Managed Care - PPO | Admitting: Family Medicine

## 2023-07-07 ENCOUNTER — Ambulatory Visit

## 2023-07-07 VITALS — BP 97/65 | HR 111 | Ht 61.0 in | Wt 111.2 lb

## 2023-07-07 DIAGNOSIS — G43009 Migraine without aura, not intractable, without status migrainosus: Secondary | ICD-10-CM

## 2023-07-07 DIAGNOSIS — M545 Low back pain, unspecified: Secondary | ICD-10-CM | POA: Diagnosis not present

## 2023-07-07 DIAGNOSIS — F418 Other specified anxiety disorders: Secondary | ICD-10-CM

## 2023-07-07 DIAGNOSIS — G8929 Other chronic pain: Secondary | ICD-10-CM

## 2023-07-07 NOTE — Patient Instructions (Signed)
 Increase Topamax to 50 mg twice a day we will try that for 2 to 3 months and see if it is helpful to reduce the headaches.  Me know when you need a new prescription and we can get it updated.

## 2023-07-07 NOTE — Assessment & Plan Note (Signed)
 We discussed maybe trying to increase the Topamax to 50 mg first before trying to get the authorization on the Nurtec.  But we can always circle back to that it is a good medication.
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
## 2023-07-07 NOTE — Assessment & Plan Note (Signed)
 Since it has been ongoing for probably at least the last 10 years we decided to go ahead and get x-rays today.  We discussed the possibility of working on core strengthening but doing to make sure that the spine looks okay in general.  Avoid prolonged sitting and prolonged standing.  Continued movement is really helpful.  We can go discuss additional treatments and therapies after we get the plain films back.

## 2023-07-07 NOTE — Assessment & Plan Note (Signed)
 With current regimen of sertraline 100 mg.  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

## 2023-07-08 ENCOUNTER — Encounter: Payer: Self-pay | Admitting: Family Medicine

## 2023-07-08 NOTE — Progress Notes (Signed)
 Hi Shalene,  X-ray overall looks good pretty good alignment disc spaces look good send no herniated or degenerative disks.  I think he would do really well with formal physical therapy for your back I think he can make a big difference and really helping you with flexibility reduction in pain and increasing your core strengthening if that something you are interested in just let me know and I will place a referral.  We have a great PT department here but if there is something closer to where you work or home just let me know.

## 2023-07-21 ENCOUNTER — Other Ambulatory Visit: Payer: Self-pay

## 2023-07-21 ENCOUNTER — Other Ambulatory Visit (HOSPITAL_BASED_OUTPATIENT_CLINIC_OR_DEPARTMENT_OTHER): Payer: Self-pay

## 2023-07-25 ENCOUNTER — Other Ambulatory Visit (HOSPITAL_BASED_OUTPATIENT_CLINIC_OR_DEPARTMENT_OTHER): Payer: Self-pay

## 2023-08-31 ENCOUNTER — Ambulatory Visit: Admitting: Family Medicine

## 2023-08-31 ENCOUNTER — Encounter: Payer: Self-pay | Admitting: Family Medicine

## 2023-08-31 VITALS — BP 94/56 | HR 60 | Ht 61.0 in | Wt 109.7 lb

## 2023-08-31 DIAGNOSIS — N760 Acute vaginitis: Secondary | ICD-10-CM | POA: Diagnosis not present

## 2023-08-31 NOTE — Progress Notes (Signed)
   Acute Office Visit  Subjective:     Patient ID: Cassidee E Cureton, female    DOB: 12/15/2000, 23 y.o.   MRN: [id number]  Chief Complaint  Patient presents with   Vaginitis    HPI Patient is in today for Pt reports that her sxs started about 1 month ago. She reports that she has been itchy, had a sour smell and some cramping but no d/c. She has the same partner x 1 year. No bumps or lesions. She stated that she has been very anxious but this is her usual.    She  hasn't tried any OTC medications for this.   ROS      Objective:    BP (!) 94/56   Pulse 60   Ht 5\' 1"  (1.549 m)   Wt 109 lb 11.2 oz (49.8 kg)   LMP 08/20/2023 (Exact Date)   SpO2 100%   BMI 20.73 kg/m    Physical Exam  No results found for any visits on 08/31/23.      Assessment & Plan:   Problem List Items Addressed This Visit   None Visit Diagnoses       Acute vaginitis    -  Primary   Relevant Orders   WET PREP FOR TRICH, YEAST, CLUE      Wet prep sent. Will call with results when available. Will treat.    No orders of the defined types were placed in this encounter.   No follow-ups on file.  Duaine German, MD

## 2023-08-31 NOTE — Progress Notes (Signed)
 Pt reports that her sxs started about 1 month ago. She reports that she has been itchy, had a sour smell and some cramping but no d/c. She has the same partner x 1 year. No bumps or lesions. She stated that she has been very anxious but this is her usual.   She  hasn't tried any OTC medications for this.

## 2023-09-01 ENCOUNTER — Ambulatory Visit: Payer: Self-pay | Admitting: Family Medicine

## 2023-09-01 LAB — WET PREP FOR TRICH, YEAST, CLUE
Clue Cell Exam: NEGATIVE
Trichomonas Exam: NEGATIVE
Yeast Exam: NEGATIVE

## 2023-09-01 NOTE — Progress Notes (Signed)
 Hi Brenda Mendoza, no yeast or bacteria. Hopefully you are feeling better.

## 2023-09-06 ENCOUNTER — Ambulatory Visit: Payer: Commercial Managed Care - PPO | Admitting: Family Medicine

## 2023-10-10 ENCOUNTER — Ambulatory Visit: Admitting: Family Medicine

## 2023-10-22 ENCOUNTER — Emergency Department (HOSPITAL_BASED_OUTPATIENT_CLINIC_OR_DEPARTMENT_OTHER)
Admission: EM | Admit: 2023-10-22 | Discharge: 2023-10-22 | Disposition: A | Discharge status: Home / Self Care | Attending: Emergency Medicine | Admitting: Emergency Medicine

## 2023-10-22 ENCOUNTER — Encounter (HOSPITAL_BASED_OUTPATIENT_CLINIC_OR_DEPARTMENT_OTHER): Payer: Self-pay | Admitting: Emergency Medicine

## 2023-10-22 ENCOUNTER — Other Ambulatory Visit: Payer: Self-pay

## 2023-10-22 ENCOUNTER — Emergency Department (HOSPITAL_BASED_OUTPATIENT_CLINIC_OR_DEPARTMENT_OTHER)

## 2023-10-22 DIAGNOSIS — M542 Cervicalgia: Secondary | ICD-10-CM | POA: Insufficient documentation

## 2023-10-22 DIAGNOSIS — Y92410 Unspecified street and highway as the place of occurrence of the external cause: Secondary | ICD-10-CM | POA: Insufficient documentation

## 2023-10-22 DIAGNOSIS — M7918 Myalgia, other site: Secondary | ICD-10-CM

## 2023-10-22 DIAGNOSIS — M79601 Pain in right arm: Secondary | ICD-10-CM | POA: Diagnosis not present

## 2023-10-22 DIAGNOSIS — M79621 Pain in right upper arm: Secondary | ICD-10-CM | POA: Diagnosis not present

## 2023-10-22 DIAGNOSIS — S40811A Abrasion of right upper arm, initial encounter: Secondary | ICD-10-CM | POA: Diagnosis not present

## 2023-10-22 DIAGNOSIS — S4991XA Unspecified injury of right shoulder and upper arm, initial encounter: Secondary | ICD-10-CM | POA: Diagnosis present

## 2023-10-22 MED ORDER — HYDROCODONE-ACETAMINOPHEN 5-325 MG PO TABS
1.0000 | ORAL_TABLET | Freq: Four times a day (QID) | ORAL | 0 refills | Status: DC | PRN
  Filled 2023-10-22: qty 6, 2d supply, fill #0

## 2023-10-22 MED ORDER — METHOCARBAMOL 500 MG PO TABS: 1000.0000 mg | ORAL_TABLET | Freq: Three times a day (TID) | ORAL | 0 refills | Status: DC

## 2023-10-22 MED ORDER — HYDROCODONE-ACETAMINOPHEN 5-325 MG PO TABS: 1.0000 | ORAL_TABLET | Freq: Four times a day (QID) | ORAL | 0 refills | Status: DC | PRN

## 2023-10-22 MED ORDER — METHOCARBAMOL 500 MG PO TABS
1000.0000 mg | ORAL_TABLET | Freq: Three times a day (TID) | ORAL | 0 refills | Status: DC
  Filled 2023-10-22: qty 30, 5d supply, fill #0

## 2023-10-22 NOTE — Discharge Instructions (Addendum)
 Take the medications as prescribed. Get plenty of down time and rest over the next couple of days.   Return to the ED with any new concerns.
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
## 2023-10-24 ENCOUNTER — Other Ambulatory Visit (HOSPITAL_BASED_OUTPATIENT_CLINIC_OR_DEPARTMENT_OTHER): Payer: Self-pay

## 2023-10-26 ENCOUNTER — Other Ambulatory Visit: Payer: Self-pay | Admitting: Family Medicine

## 2023-10-26 DIAGNOSIS — G43109 Migraine with aura, not intractable, without status migrainosus: Secondary | ICD-10-CM

## 2023-10-31 ENCOUNTER — Other Ambulatory Visit (HOSPITAL_BASED_OUTPATIENT_CLINIC_OR_DEPARTMENT_OTHER): Payer: Self-pay

## 2023-10-31 MED ORDER — TOPIRAMATE 50 MG PO TABS
50.0000 mg | ORAL_TABLET | Freq: Two times a day (BID) | ORAL | 1 refills | Status: AC
  Filled 2023-10-31: qty 180, 90d supply, fill #0

## 2023-10-31 NOTE — Telephone Encounter (Signed)
 Message from pt states 50mg  was supposed to have been prescribed. Dr. Alvan, please advise on this.

## 2023-11-08 ENCOUNTER — Encounter: Payer: Self-pay | Admitting: Family Medicine

## 2023-11-15 ENCOUNTER — Ambulatory Visit (INDEPENDENT_AMBULATORY_CARE_PROVIDER_SITE_OTHER): Admitting: Sports Medicine

## 2023-11-15 ENCOUNTER — Ambulatory Visit (INDEPENDENT_AMBULATORY_CARE_PROVIDER_SITE_OTHER)

## 2023-11-15 ENCOUNTER — Other Ambulatory Visit (HOSPITAL_BASED_OUTPATIENT_CLINIC_OR_DEPARTMENT_OTHER): Payer: Self-pay

## 2023-11-15 VITALS — BP 108/72 | HR 83 | Ht 61.0 in | Wt 109.0 lb

## 2023-11-15 DIAGNOSIS — M542 Cervicalgia: Secondary | ICD-10-CM | POA: Diagnosis not present

## 2023-11-15 DIAGNOSIS — R079 Chest pain, unspecified: Secondary | ICD-10-CM | POA: Diagnosis not present

## 2023-11-15 DIAGNOSIS — S134XXA Sprain of ligaments of cervical spine, initial encounter: Secondary | ICD-10-CM

## 2023-11-15 DIAGNOSIS — Z041 Encounter for examination and observation following transport accident: Secondary | ICD-10-CM | POA: Diagnosis not present

## 2023-11-15 MED ORDER — PREDNISONE 50 MG PO TABS
50.0000 mg | ORAL_TABLET | Freq: Every day | ORAL | 0 refills | Status: DC
  Filled 2023-11-15: qty 5, 5d supply, fill #0
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
## 2023-11-29 ENCOUNTER — Ambulatory Visit: Payer: Self-pay | Admitting: Family Medicine

## 2023-11-29 NOTE — Progress Notes (Signed)
 HI Brenda Mendoza, yor xray looks good, how are you feeling?  Are you getting some better. If not then we can refer to PT to have the work on the muscle tissue.

## 2023-11-29 NOTE — Progress Notes (Signed)
 Hi Rylea, I am covering for Dr. IVAR Cheng news!  No sign of fracture or dislocation.  Hopefully you are starting to feel better.  We can always put you into formal PT if you are still having some pain and tightness or spasms.

## 2023-12-06 ENCOUNTER — Encounter: Payer: Self-pay | Admitting: Sports Medicine

## 2023-12-07 ENCOUNTER — Other Ambulatory Visit: Payer: Self-pay | Admitting: Family Medicine

## 2023-12-09 ENCOUNTER — Other Ambulatory Visit (HOSPITAL_BASED_OUTPATIENT_CLINIC_OR_DEPARTMENT_OTHER): Payer: Self-pay

## 2023-12-09 MED ORDER — LANSOPRAZOLE 30 MG PO CPDR
30.0000 mg | DELAYED_RELEASE_CAPSULE | Freq: Every morning | ORAL | 0 refills | Status: AC
  Filled 2023-12-09: qty 90, 90d supply, fill #0

## 2023-12-12 ENCOUNTER — Ambulatory Visit: Admitting: Sports Medicine

## 2024-01-04 ENCOUNTER — Other Ambulatory Visit: Payer: Self-pay | Admitting: Family Medicine

## 2024-01-04 DIAGNOSIS — Z3009 Encounter for other general counseling and advice on contraception: Secondary | ICD-10-CM

## 2024-01-04 DIAGNOSIS — F418 Other specified anxiety disorders: Secondary | ICD-10-CM

## 2024-01-05 ENCOUNTER — Other Ambulatory Visit (HOSPITAL_BASED_OUTPATIENT_CLINIC_OR_DEPARTMENT_OTHER): Payer: Self-pay

## 2024-01-05 MED ORDER — NORELGESTROMIN-ETH ESTRADIOL 150-35 MCG/24HR TD PTWK
1.0000 | MEDICATED_PATCH | TRANSDERMAL | 4 refills | Status: AC
  Filled 2024-01-05: qty 9, 63d supply, fill #0
  Filled 2024-03-27: qty 9, 63d supply, fill #1

## 2024-01-05 MED ORDER — SERTRALINE HCL 100 MG PO TABS
100.0000 mg | ORAL_TABLET | Freq: Every day | ORAL | 1 refills | Status: AC
  Filled 2024-01-05: qty 90, 90d supply, fill #0

## 2024-02-20 ENCOUNTER — Encounter: Payer: Self-pay | Admitting: Family Medicine

## 2024-02-20 ENCOUNTER — Ambulatory Visit: Admitting: Family Medicine

## 2024-02-20 ENCOUNTER — Other Ambulatory Visit (HOSPITAL_BASED_OUTPATIENT_CLINIC_OR_DEPARTMENT_OTHER): Payer: Self-pay

## 2024-02-20 VITALS — BP 117/62 | HR 90 | Ht 61.0 in | Wt 111.8 lb

## 2024-02-20 DIAGNOSIS — G43009 Migraine without aura, not intractable, without status migrainosus: Secondary | ICD-10-CM | POA: Diagnosis not present

## 2024-02-20 DIAGNOSIS — R1010 Upper abdominal pain, unspecified: Secondary | ICD-10-CM | POA: Diagnosis not present

## 2024-02-20 MED ORDER — ONDANSETRON 8 MG PO TBDP
8.0000 mg | ORAL_TABLET | Freq: Three times a day (TID) | ORAL | 1 refills | Status: AC | PRN
  Filled 2024-02-20: qty 20, 7d supply, fill #0

## 2024-02-20 NOTE — Progress Notes (Signed)
 Acute Office Visit  Patient ID: Brenda Mendoza, female    DOB: 2000-05-26, 23 y.o.   MRN: [id number]  PCP: Alvan Dorothyann BIRCH, MD  Chief Complaint  Patient presents with   Medical Management of Chronic Issues    Pt reports that she has been experiencing some GI issues. She states that this has gotten worse the past 1.5 weeks. She feels that her medication may not be as effective as it was previously.     Subjective:     HPI  Discussed the use of AI scribe software for clinical note transcription with the patient, who gave verbal consent to proceed.  History of Present Illness Brenda Mendoza is a 23 year old female who presents with upper abdominal pain and nausea.  Upper abdominal pain and nausea - Upper abdominal pain occurs almost daily, primarily after eating - Pain is located above the umbilicus - Pain severity requires her to sit down - Associated with nausea - One episode of vomiting at work - One episode of feeling shaky - No pelvic cramping, back pain, or constipation - Regular bowel movements  Gastrointestinal medication use and efficacy - Currently taking Prevacid , but perceives decreased efficacy compared to prior use - Takes Prevacid  after meals instead of before meals - Previously used a different stomach medication, switched to Prevacid  for ease of swallowing - History of EGD at age 33 showing mild gastritis - Breath test for H. pylori was negative  Contributing factors and medication side effects - Takes Zoloft  and Topamax , which can cause nausea if taken with food - Consumes caffeine and carbonated beverages - Works in an environment with easy access to soda   ROS     Objective:    BP 117/62   Pulse 90   Ht 5' 1 (1.549 m)   Wt 111 lb 12.8 oz (50.7 kg)   SpO2 95%   BMI 21.12 kg/m    Physical Exam Vitals reviewed.  Constitutional:      Appearance: Normal appearance.  HENT:     Head: Normocephalic.  Pulmonary:     Effort:  Pulmonary effort is normal.  Abdominal:     General: Bowel sounds are normal. There is no distension.     Palpations: Abdomen is soft.     Tenderness: There is abdominal tenderness.  Neurological:     Mental Status: She is alert and oriented to person, place, and time.  Psychiatric:        Mood and Affect: Mood normal.        Behavior: Behavior normal.       No results found for any visits on 02/20/24.     Assessment & Plan:   Problem List Items Addressed This Visit       Cardiovascular and Mediastinum   Migraine without aura   Relevant Medications   ondansetron  (ZOFRAN -ODT) 8 MG disintegrating tablet   Other Visit Diagnoses       Upper abdominal pain    -  Primary       Assessment and Plan Assessment & Plan Epigastric pain with nausea and vomiting and history of gastritis Chronic epigastric pain with nausea and vomiting, likely due to breakthrough symptoms on current Prevacid  regimen. Differential includes possible ulcer or other gastrointestinal pathology. Discussed endoscopy and medication adjustment. Emphasized Prevacid  timing and dietary modifications. - Instructed to adjust Prevacid  timing to 20 minutes before meals. - Consider increasing Prevacid  to twice daily if symptoms persist after timing adjustment. - Refer to  gastroenterology for endoscopy if symptoms do not improve. - Advised dietary modifications to reduce carbonated beverages, caffeine, greasy, spicy, and fried foods. - Refilled Zofran  for nausea management.    Meds ordered this encounter  Medications   ondansetron  (ZOFRAN -ODT) 8 MG disintegrating tablet    Sig: Take 1 tablet (8 mg total) by mouth every 8 (eight) hours as needed.    Dispense:  20 tablet    Refill:  1    No follow-ups on file.  Dorothyann Byars, MD [facility] Primary Care & Sports Medicine at [facility]
